# Patient Record
Sex: Female | Born: 1945 | Race: White | Hispanic: No | Marital: Married | Smoking: Former smoker
Health system: Southern US, Community
[De-identification: ages and names within clinical notes are randomized; demographics above are authoritative.]

## PROBLEM LIST (undated history)

## (undated) DIAGNOSIS — IMO0001 Reserved for inherently not codable concepts without codable children: Secondary | ICD-10-CM

## (undated) DIAGNOSIS — F329 Major depressive disorder, single episode, unspecified: Secondary | ICD-10-CM

## (undated) DIAGNOSIS — K219 Gastro-esophageal reflux disease without esophagitis: Secondary | ICD-10-CM

## (undated) DIAGNOSIS — G4700 Insomnia, unspecified: Secondary | ICD-10-CM

## (undated) DIAGNOSIS — G629 Polyneuropathy, unspecified: Secondary | ICD-10-CM

## (undated) DIAGNOSIS — J189 Pneumonia, unspecified organism: Secondary | ICD-10-CM

## (undated) DIAGNOSIS — I10 Essential (primary) hypertension: Secondary | ICD-10-CM

## (undated) DIAGNOSIS — M797 Fibromyalgia: Secondary | ICD-10-CM

## (undated) DIAGNOSIS — G2581 Restless legs syndrome: Secondary | ICD-10-CM

## (undated) DIAGNOSIS — R112 Nausea with vomiting, unspecified: Secondary | ICD-10-CM

## (undated) DIAGNOSIS — D649 Anemia, unspecified: Secondary | ICD-10-CM

## (undated) DIAGNOSIS — F32A Depression, unspecified: Secondary | ICD-10-CM

## (undated) DIAGNOSIS — Z9889 Other specified postprocedural states: Secondary | ICD-10-CM

## (undated) DIAGNOSIS — M1990 Unspecified osteoarthritis, unspecified site: Secondary | ICD-10-CM

## (undated) DIAGNOSIS — K760 Fatty (change of) liver, not elsewhere classified: Secondary | ICD-10-CM

## (undated) DIAGNOSIS — R197 Diarrhea, unspecified: Secondary | ICD-10-CM

## (undated) HISTORY — PX: ABDOMINAL HYSTERECTOMY: SHX81

## (undated) HISTORY — PX: CARPAL TUNNEL RELEASE: SHX101

## (undated) HISTORY — PX: OTHER SURGICAL HISTORY: SHX169

## (undated) HISTORY — PX: KNEE ARTHROSCOPY: SUR90

## (undated) HISTORY — PX: BRAIN SURGERY: SHX531

## (undated) HISTORY — PX: COLONOSCOPY: SHX174

## (undated) HISTORY — PX: TUBAL LIGATION: SHX77

## (undated) HISTORY — PX: KNEE ARTHROPLASTY: SHX992

## (undated) HISTORY — PX: CHOLECYSTECTOMY: SHX55

## (undated) HISTORY — PX: TONSILLECTOMY: SUR1361

---

## 2015-03-25 ENCOUNTER — Other Ambulatory Visit: Payer: Self-pay | Admitting: Neurosurgery

## 2015-03-25 DIAGNOSIS — M5137 Other intervertebral disc degeneration, lumbosacral region: Secondary | ICD-10-CM

## 2015-04-02 ENCOUNTER — Ambulatory Visit
Admission: RE | Admit: 2015-04-02 | Discharge: 2015-04-02 | Disposition: A | Discharge status: Home / Self Care | Payer: Medicare Other | Source: Ambulatory Visit | Attending: Neurosurgery | Admitting: Neurosurgery

## 2015-04-02 DIAGNOSIS — M5137 Other intervertebral disc degeneration, lumbosacral region: Secondary | ICD-10-CM

## 2015-04-02 MED ORDER — DIAZEPAM 5 MG PO TABS
10.0000 mg | ORAL_TABLET | Freq: Once | ORAL | Status: AC
  Administered 2015-04-02: 5 mg via ORAL

## 2015-04-02 MED ORDER — ONDANSETRON HCL 4 MG/2ML IJ SOLN: 4.0000 mg | Freq: Four times a day (QID) | INTRAMUSCULAR | Status: DC | PRN

## 2015-04-02 MED ORDER — IOHEXOL 180 MG/ML  SOLN
15.0000 mL | Freq: Once | INTRAMUSCULAR | Status: AC | PRN
  Administered 2015-04-02: 15 mL via INTRATHECAL

## 2015-04-02 NOTE — Progress Notes (Signed)
Patient states she has been off Lexapro and Trazodone for at least the past two days.

## 2015-04-02 NOTE — Discharge Instructions (Addendum)
Myelogram Discharge Instructions  1. Go home and rest quietly for the next 24 hours.  It is important to lie flat for the next 24 hours.  Get up only to go to the restroom.  You may lie in the bed or on a couch on your back, your stomach, your left side or your right side.  You may have one pillow under your head.  You may have pillows between your knees while you are on your side or under your knees while you are on your back.  2. DO NOT drive today.  Recline the seat as far back as it will go, while still wearing your seat belt, on the way home.  3. You may get up to go to the bathroom as needed.  You may sit up for 10 minutes to eat.  You may resume your normal diet and medications unless otherwise indicated.  Drink lots of extra fluids today and tomorrow.  4. The incidence of headache, nausea, or vomiting is about 5% (one in 20 patients).  If you develop a headache, lie flat and drink plenty of fluids until the headache goes away.  Caffeinated beverages may be helpful.  If you develop severe nausea and vomiting or a headache that does not go away with flat bed rest, call [phone].  5. You may resume normal activities after your 24 hours of bed rest is over; however, do not exert yourself strongly or do any heavy lifting tomorrow. If when you get up you have a headache when standing, go back to bed and force fluids for another 24 hours.  6. Call your physician for a follow-up appointment.  The results of your myelogram will be sent directly to your physician by the following day.  7. If you have any questions or if complications develop after you arrive home, please call [phone].  Discharge instructions have been explained to the patient.  The patient, or the person responsible for the patient, fully understands these instructions.       May resume Lexapro and Trazodone on April 03, 2015, after 11:00 am.

## 2015-04-27 ENCOUNTER — Other Ambulatory Visit: Payer: Self-pay | Admitting: Neurosurgery

## 2015-05-12 ENCOUNTER — Encounter (HOSPITAL_COMMUNITY): Payer: Self-pay

## 2015-05-12 ENCOUNTER — Encounter (HOSPITAL_COMMUNITY)
Admission: RE | Admit: 2015-05-12 | Discharge: 2015-05-12 | Disposition: A | Discharge status: Home / Self Care | Payer: Medicare Other | Source: Ambulatory Visit | Attending: Neurosurgery | Admitting: Neurosurgery

## 2015-05-12 ENCOUNTER — Other Ambulatory Visit (HOSPITAL_COMMUNITY): Payer: Self-pay | Admitting: *Deleted

## 2015-05-12 DIAGNOSIS — Z0183 Encounter for blood typing: Secondary | ICD-10-CM | POA: Insufficient documentation

## 2015-05-12 DIAGNOSIS — Z01810 Encounter for preprocedural cardiovascular examination: Secondary | ICD-10-CM | POA: Insufficient documentation

## 2015-05-12 DIAGNOSIS — I10 Essential (primary) hypertension: Secondary | ICD-10-CM | POA: Insufficient documentation

## 2015-05-12 DIAGNOSIS — Z01812 Encounter for preprocedural laboratory examination: Secondary | ICD-10-CM | POA: Diagnosis not present

## 2015-05-12 HISTORY — DX: Restless legs syndrome: G25.81

## 2015-05-12 HISTORY — DX: Essential (primary) hypertension: I10

## 2015-05-12 HISTORY — DX: Pneumonia, unspecified organism: J18.9

## 2015-05-12 HISTORY — DX: Major depressive disorder, single episode, unspecified: F32.9

## 2015-05-12 HISTORY — DX: Insomnia, unspecified: G47.00

## 2015-05-12 HISTORY — DX: Fatty (change of) liver, not elsewhere classified: K76.0

## 2015-05-12 HISTORY — DX: Anemia, unspecified: D64.9

## 2015-05-12 HISTORY — DX: Reserved for inherently not codable concepts without codable children: IMO0001

## 2015-05-12 HISTORY — DX: Polyneuropathy, unspecified: G62.9

## 2015-05-12 HISTORY — DX: Unspecified osteoarthritis, unspecified site: M19.90

## 2015-05-12 HISTORY — DX: Gastro-esophageal reflux disease without esophagitis: K21.9

## 2015-05-12 HISTORY — DX: Depression, unspecified: F32.A

## 2015-05-12 HISTORY — DX: Diarrhea, unspecified: R19.7

## 2015-05-12 HISTORY — DX: Nausea with vomiting, unspecified: R11.2

## 2015-05-12 HISTORY — DX: Nausea with vomiting, unspecified: Z98.890

## 2015-05-12 LAB — COMPREHENSIVE METABOLIC PANEL
ALK PHOS: 76 U/L (ref 38–126)
ALT: 28 U/L (ref 14–54)
AST: 24 U/L (ref 15–41)
Albumin: 3.8 g/dL (ref 3.5–5.0)
Anion gap: 5 (ref 5–15)
BUN: 12 mg/dL (ref 6–20)
CALCIUM: 9.5 mg/dL (ref 8.9–10.3)
CHLORIDE: 102 mmol/L (ref 101–111)
CO2: 30 mmol/L (ref 22–32)
CREATININE: 0.86 mg/dL (ref 0.44–1.00)
GFR calc Af Amer: >60 mL/min (ref >60)
Glucose, Bld: 111 mg/dL — ABNORMAL HIGH (ref 65–99)
Potassium: 4.2 mmol/L (ref 3.5–5.1)
Sodium: 137 mmol/L (ref 135–145)
Total Bilirubin: 0.6 mg/dL (ref 0.3–1.2)
Total Protein: 6.2 g/dL — ABNORMAL LOW (ref 6.5–8.1)

## 2015-05-12 LAB — CBC
HEMATOCRIT: 40.7 % (ref 36.0–46.0)
Hemoglobin: 13.3 g/dL (ref 12.0–15.0)
MCH: 30.4 pg (ref 26.0–34.0)
MCHC: 32.7 g/dL (ref 30.0–36.0)
MCV: 92.9 fL (ref 78.0–100.0)
PLATELETS: 235 10*3/uL (ref 150–400)
RBC: 4.38 MIL/uL (ref 3.87–5.11)
RDW: 12.7 % (ref 11.5–15.5)
WBC: 5.7 10*3/uL (ref 4.0–10.5)

## 2015-05-12 LAB — TYPE AND SCREEN
ABO/RH(D): A POS
Antibody Screen: NEGATIVE

## 2015-05-12 LAB — ABO/RH: ABO/RH(D): A POS

## 2015-05-12 LAB — SURGICAL PCR SCREEN
MRSA, PCR: NEGATIVE
Staphylococcus aureus: POSITIVE — AB

## 2015-05-12 NOTE — Progress Notes (Signed)
Mupirocin Ointment Rx called into [facility] on [address], [place] for positive PCR of Staph. Pt notified and voiced understanding.

## 2015-05-12 NOTE — Pre-Procedure Instructions (Signed)
Joyce Kelly  05/12/2015     Your procedure is scheduled on Wednesday, May 20, 2015 at 9:30 AM.   Report to [facility] Entrance "A" Admitting Office at 7:30 AM.   Call this number if you have problems the morning of surgery: [phone]   Any questions prior to day of surgery, please call [phone] between 8 & 4 PM.   Remember:  Do not eat food or drink liquids after midnight Tuesday, 05/19/15.  Take these medicines the morning of surgery with A SIP OF WATER: Excitalopram (Lexapro), Gabapentin (Neurontin), Ropinirole (Requip), Hydrocodone - if needed  Stop Meloxicam, Herbal Medications and Vitamins 7 days prior to surgery   Do not wear jewelry, make-up or nail polish.  Do not wear lotions, powders, or perfumes.  You may wear deodorant.  Do not shave 48 hours prior to surgery.    Do not bring valuables to the hospital.  [facility] is not responsible for any belongings or valuables.  Contacts, dentures or bridgework may not be worn into surgery.  Leave your suitcase in the car.  After surgery it may be brought to your room.  For patients admitted to the hospital, discharge time will be determined by your treatment team.  Special instructions:  [place] - Preparing for Surgery  Before surgery, you can play an important role.  Because skin is not sterile, your skin needs to be as free of germs as possible.  You can reduce the number of germs on you skin by washing with CHG (chlorahexidine gluconate) soap before surgery.  CHG is an antiseptic cleaner which kills germs and bonds with the skin to continue killing germs even after washing.  Please DO NOT use if you have an allergy to CHG or antibacterial soaps.  If your skin becomes reddened/irritated stop using the CHG and inform your nurse when you arrive at Short Stay.  Do not shave (including legs and underarms) for at least 48 hours prior to the first CHG shower.  You may shave your face.  Please follow these  instructions carefully:   1.  Shower with CHG Soap the night before surgery and the                                morning of Surgery.  2.  If you choose to wash your hair, wash your hair first as usual with your       normal shampoo.  3.  After you shampoo, rinse your hair and body thoroughly to remove the                      Shampoo.  4.  Use CHG as you would any other liquid soap.  You can apply chg directly       to the skin and wash gently with scrungie or a clean washcloth.  5.  Apply the CHG Soap to your body ONLY FROM THE NECK DOWN.        Do not use on open wounds or open sores.  Avoid contact with your eyes, ears, mouth and genitals (private parts).  Wash genitals (private parts) with your normal soap.  6.  Wash thoroughly, paying special attention to the area where your surgery        will be performed.  7.  Thoroughly rinse your body with warm water from the neck down.  8.  DO NOT  shower/wash with your normal soap after using and rinsing off       the CHG Soap.  9.  Pat yourself dry with a clean towel.            10.  Wear clean pajamas.            11.  Place clean sheets on your bed the night of your first shower and do not        sleep with pets.  Day of Surgery  Do not apply any lotions the morning of surgery.  Please wear clean clothes to the hospital.   Please read over the following fact sheets that you were given. Pain Booklet, Coughing and Deep Breathing, Blood Transfusion Information, MRSA Information and Surgical Site Infection Prevention

## 2015-05-12 NOTE — Progress Notes (Signed)
Pt denies cardiac history, denies any chest pain. Pt's PCP is Dr. Julius Bowels in [facility].

## 2015-05-20 ENCOUNTER — Inpatient Hospital Stay (HOSPITAL_COMMUNITY): Payer: Medicare Other | Admitting: Certified Registered Nurse Anesthetist

## 2015-05-20 ENCOUNTER — Encounter (HOSPITAL_COMMUNITY)
Admission: RE | Disposition: A | Discharge status: Home / Self Care | Payer: Medicare Other | Source: Ambulatory Visit | Attending: Neurosurgery

## 2015-05-20 ENCOUNTER — Inpatient Hospital Stay (HOSPITAL_COMMUNITY): Payer: Medicare Other

## 2015-05-20 ENCOUNTER — Encounter (HOSPITAL_COMMUNITY): Payer: Self-pay | Admitting: General Practice

## 2015-05-20 ENCOUNTER — Inpatient Hospital Stay (HOSPITAL_COMMUNITY)
Admission: RE | Admit: 2015-05-20 | Discharge: 2015-05-23 | DRG: 460 | Disposition: A | Discharge status: Home / Self Care | Payer: Medicare Other | Source: Ambulatory Visit | Attending: Neurosurgery | Admitting: Neurosurgery

## 2015-05-20 DIAGNOSIS — Z8249 Family history of ischemic heart disease and other diseases of the circulatory system: Secondary | ICD-10-CM | POA: Diagnosis not present

## 2015-05-20 DIAGNOSIS — M5417 Radiculopathy, lumbosacral region: Secondary | ICD-10-CM | POA: Diagnosis not present

## 2015-05-20 DIAGNOSIS — M4316 Spondylolisthesis, lumbar region: Principal | ICD-10-CM | POA: Diagnosis present

## 2015-05-20 DIAGNOSIS — Z885 Allergy status to narcotic agent status: Secondary | ICD-10-CM | POA: Diagnosis not present

## 2015-05-20 DIAGNOSIS — K219 Gastro-esophageal reflux disease without esophagitis: Secondary | ICD-10-CM | POA: Diagnosis present

## 2015-05-20 DIAGNOSIS — Z881 Allergy status to other antibiotic agents status: Secondary | ICD-10-CM

## 2015-05-20 DIAGNOSIS — M4326 Fusion of spine, lumbar region: Secondary | ICD-10-CM

## 2015-05-20 DIAGNOSIS — Z87891 Personal history of nicotine dependence: Secondary | ICD-10-CM | POA: Diagnosis not present

## 2015-05-20 DIAGNOSIS — M545 Low back pain: Secondary | ICD-10-CM | POA: Diagnosis present

## 2015-05-20 DIAGNOSIS — I10 Essential (primary) hypertension: Secondary | ICD-10-CM | POA: Diagnosis present

## 2015-05-20 DIAGNOSIS — Z886 Allergy status to analgesic agent status: Secondary | ICD-10-CM | POA: Diagnosis not present

## 2015-05-20 DIAGNOSIS — M4807 Spinal stenosis, lumbosacral region: Secondary | ICD-10-CM | POA: Diagnosis present

## 2015-05-20 DIAGNOSIS — Z887 Allergy status to serum and vaccine status: Secondary | ICD-10-CM | POA: Diagnosis not present

## 2015-05-20 DIAGNOSIS — M48061 Spinal stenosis, lumbar region without neurogenic claudication: Secondary | ICD-10-CM | POA: Diagnosis present

## 2015-05-20 SURGERY — POSTERIOR LUMBAR FUSION 2 LEVEL
Anesthesia: General | Site: Back

## 2015-05-20 MED ORDER — MIDAZOLAM HCL 5 MG/5ML IJ SOLN
INTRAMUSCULAR | Status: DC | PRN
  Administered 2015-05-20: 2 mg via INTRAVENOUS

## 2015-05-20 MED ORDER — SODIUM CHLORIDE 0.9 % IJ SOLN
INTRAMUSCULAR | Status: AC
  Filled 2015-05-20: qty 10

## 2015-05-20 MED ORDER — DIPHENHYDRAMINE HCL 25 MG PO CAPS: 25.0000 mg | ORAL_CAPSULE | Freq: Every evening | ORAL | Status: DC | PRN

## 2015-05-20 MED ORDER — GABAPENTIN 100 MG PO CAPS
100.0000 mg | ORAL_CAPSULE | Freq: Three times a day (TID) | ORAL | Status: DC
  Administered 2015-05-20 – 2015-05-23 (×9): 100 mg via ORAL
  Filled 2015-05-20 (×9): qty 1

## 2015-05-20 MED ORDER — THROMBIN 20000 UNITS EX SOLR
CUTANEOUS | Status: DC | PRN
  Administered 2015-05-20 (×2): 20 mL via TOPICAL

## 2015-05-20 MED ORDER — CEFAZOLIN SODIUM-DEXTROSE 2-3 GM-% IV SOLR
2.0000 g | INTRAVENOUS | Status: AC
  Administered 2015-05-20 (×2): 2 g via INTRAVENOUS
  Filled 2015-05-20: qty 50

## 2015-05-20 MED ORDER — MENTHOL 3 MG MT LOZG: 1.0000 | LOZENGE | OROMUCOSAL | Status: DC | PRN

## 2015-05-20 MED ORDER — PROPOFOL 10 MG/ML IV BOLUS
INTRAVENOUS | Status: DC | PRN
  Administered 2015-05-20: 100 mg via INTRAVENOUS

## 2015-05-20 MED ORDER — PROPOFOL 10 MG/ML IV BOLUS
INTRAVENOUS | Status: AC
  Filled 2015-05-20: qty 20

## 2015-05-20 MED ORDER — IRBESARTAN 300 MG PO TABS
300.0000 mg | ORAL_TABLET | Freq: Every day | ORAL | Status: DC
  Administered 2015-05-20 – 2015-05-22 (×3): 300 mg via ORAL
  Filled 2015-05-20 (×4): qty 1

## 2015-05-20 MED ORDER — FENTANYL CITRATE (PF) 100 MCG/2ML IJ SOLN
INTRAMUSCULAR | Status: AC
  Filled 2015-05-20: qty 2

## 2015-05-20 MED ORDER — FENTANYL CITRATE (PF) 250 MCG/5ML IJ SOLN
INTRAMUSCULAR | Status: AC
  Filled 2015-05-20: qty 5

## 2015-05-20 MED ORDER — SODIUM CHLORIDE 0.9 % IJ SOLN
3.0000 mL | Freq: Two times a day (BID) | INTRAMUSCULAR | Status: DC
  Administered 2015-05-20 – 2015-05-22 (×3): 3 mL via INTRAVENOUS

## 2015-05-20 MED ORDER — SODIUM CHLORIDE 0.9 % IR SOLN
Status: DC | PRN
  Administered 2015-05-20: 500 mL

## 2015-05-20 MED ORDER — CEFAZOLIN SODIUM-DEXTROSE 2-3 GM-% IV SOLR
2.0000 g | Freq: Three times a day (TID) | INTRAVENOUS | Status: AC
Start: 2015-05-20 — End: 2015-05-22
  Administered 2015-05-20 – 2015-05-22 (×6): 2 g via INTRAVENOUS
  Filled 2015-05-20 (×7): qty 50

## 2015-05-20 MED ORDER — HYDROCODONE-ACETAMINOPHEN 10-325 MG PO TABS
1.0000 | ORAL_TABLET | Freq: Four times a day (QID) | ORAL | Status: DC | PRN
  Administered 2015-05-21 – 2015-05-22 (×2): 1 via ORAL
  Filled 2015-05-20 (×2): qty 1

## 2015-05-20 MED ORDER — MIDAZOLAM HCL 2 MG/2ML IJ SOLN
INTRAMUSCULAR | Status: AC
  Filled 2015-05-20: qty 4

## 2015-05-20 MED ORDER — LIDOCAINE-EPINEPHRINE 1 %-1:100000 IJ SOLN
INTRAMUSCULAR | Status: DC | PRN
  Administered 2015-05-20: 10 mL

## 2015-05-20 MED ORDER — ACETAMINOPHEN 325 MG PO TABS: 650.0000 mg | ORAL_TABLET | ORAL | Status: DC | PRN

## 2015-05-20 MED ORDER — ONDANSETRON HCL 4 MG/2ML IJ SOLN
INTRAMUSCULAR | Status: AC
  Filled 2015-05-20: qty 2

## 2015-05-20 MED ORDER — NEOSTIGMINE METHYLSULFATE 10 MG/10ML IV SOLN
INTRAVENOUS | Status: DC | PRN
  Administered 2015-05-20: 4.5 mg via INTRAVENOUS

## 2015-05-20 MED ORDER — ESCITALOPRAM OXALATE 10 MG PO TABS
10.0000 mg | ORAL_TABLET | Freq: Every day | ORAL | Status: DC
  Administered 2015-05-21 – 2015-05-23 (×3): 10 mg via ORAL
  Filled 2015-05-20 (×3): qty 1

## 2015-05-20 MED ORDER — POTASSIUM CHLORIDE CRYS ER 10 MEQ PO TBCR
10.0000 meq | EXTENDED_RELEASE_TABLET | Freq: Every day | ORAL | Status: DC
  Administered 2015-05-20 – 2015-05-23 (×4): 10 meq via ORAL
  Filled 2015-05-20 (×4): qty 1

## 2015-05-20 MED ORDER — ROCURONIUM BROMIDE 50 MG/5ML IV SOLN
INTRAVENOUS | Status: AC
  Filled 2015-05-20: qty 1

## 2015-05-20 MED ORDER — GLYCOPYRROLATE 0.2 MG/ML IJ SOLN
INTRAMUSCULAR | Status: AC
  Filled 2015-05-20: qty 4

## 2015-05-20 MED ORDER — PROMETHAZINE HCL 25 MG/ML IJ SOLN: 6.2500 mg | INTRAMUSCULAR | Status: DC | PRN

## 2015-05-20 MED ORDER — POTASSIUM GLUCONATE 550 MG PO TABS: 1.0000 | ORAL_TABLET | Freq: Three times a day (TID) | ORAL | Status: DC

## 2015-05-20 MED ORDER — ROCURONIUM BROMIDE 50 MG/5ML IV SOLN
INTRAVENOUS | Status: AC
  Filled 2015-05-20: qty 3

## 2015-05-20 MED ORDER — LACTATED RINGERS IV SOLN
INTRAVENOUS | Status: DC
  Administered 2015-05-20 (×2): via INTRAVENOUS

## 2015-05-20 MED ORDER — PROPOFOL 500 MG/50ML IV EMUL
INTRAVENOUS | Status: AC
  Filled 2015-05-20: qty 100

## 2015-05-20 MED ORDER — ARTIFICIAL TEARS OP OINT
TOPICAL_OINTMENT | OPHTHALMIC | Status: DC | PRN
  Administered 2015-05-20: 1 via OPHTHALMIC

## 2015-05-20 MED ORDER — DEXAMETHASONE SODIUM PHOSPHATE 10 MG/ML IJ SOLN
10.0000 mg | INTRAMUSCULAR | Status: AC
  Administered 2015-05-20: 10 mg via INTRAVENOUS
  Filled 2015-05-20: qty 1

## 2015-05-20 MED ORDER — VANCOMYCIN HCL 1000 MG IV SOLR
INTRAVENOUS | Status: DC | PRN
  Administered 2015-05-20: 1000 mg

## 2015-05-20 MED ORDER — SODIUM CHLORIDE 0.9 % IV SOLN: 250.0000 mL | INTRAVENOUS | Status: DC

## 2015-05-20 MED ORDER — ROCURONIUM BROMIDE 100 MG/10ML IV SOLN
INTRAVENOUS | Status: DC | PRN
  Administered 2015-05-20 (×2): 10 mg via INTRAVENOUS
  Administered 2015-05-20: 20 mg via INTRAVENOUS
  Administered 2015-05-20 (×2): 10 mg via INTRAVENOUS
  Administered 2015-05-20: 20 mg via INTRAVENOUS
  Administered 2015-05-20 (×2): 10 mg via INTRAVENOUS
  Administered 2015-05-20: 50 mg via INTRAVENOUS

## 2015-05-20 MED ORDER — OLMESARTAN MEDOXOMIL-HCTZ 40-12.5 MG PO TABS: 1.0000 | ORAL_TABLET | Freq: Every day | ORAL | Status: DC

## 2015-05-20 MED ORDER — BUPIVACAINE LIPOSOME 1.3 % IJ SUSP
20.0000 mL | INTRAMUSCULAR | Status: AC
  Filled 2015-05-20: qty 20

## 2015-05-20 MED ORDER — CYCLOBENZAPRINE HCL 10 MG PO TABS
10.0000 mg | ORAL_TABLET | Freq: Three times a day (TID) | ORAL | Status: DC | PRN
  Administered 2015-05-21: 10 mg via ORAL
  Filled 2015-05-20: qty 1

## 2015-05-20 MED ORDER — PHENOL 1.4 % MT LIQD
1.0000 | OROMUCOSAL | Status: DC | PRN
Start: 2015-05-20 — End: 2015-05-23

## 2015-05-20 MED ORDER — SODIUM CHLORIDE 0.9 % IJ SOLN: 3.0000 mL | INTRAMUSCULAR | Status: DC | PRN

## 2015-05-20 MED ORDER — TRAZODONE HCL 100 MG PO TABS
100.0000 mg | ORAL_TABLET | Freq: Every day | ORAL | Status: DC
  Administered 2015-05-20 – 2015-05-22 (×3): 100 mg via ORAL
  Filled 2015-05-20 (×3): qty 1

## 2015-05-20 MED ORDER — EPHEDRINE SULFATE 50 MG/ML IJ SOLN
INTRAMUSCULAR | Status: DC | PRN
  Administered 2015-05-20: 5 mg via INTRAVENOUS
  Administered 2015-05-20: 15 mg via INTRAVENOUS

## 2015-05-20 MED ORDER — GLYCOPYRROLATE 0.2 MG/ML IJ SOLN
INTRAMUSCULAR | Status: AC
  Filled 2015-05-20: qty 1

## 2015-05-20 MED ORDER — SCOPOLAMINE 1 MG/3DAYS TD PT72
MEDICATED_PATCH | TRANSDERMAL | Status: AC
  Administered 2015-05-20: 1 via TRANSDERMAL
  Filled 2015-05-20: qty 1

## 2015-05-20 MED ORDER — OXYCODONE-ACETAMINOPHEN 5-325 MG PO TABS
ORAL_TABLET | ORAL | Status: AC
Start: 2015-05-20 — End: 2015-05-21
  Filled 2015-05-20: qty 2

## 2015-05-20 MED ORDER — HYDROCHLOROTHIAZIDE 12.5 MG PO CAPS
12.5000 mg | ORAL_CAPSULE | Freq: Every day | ORAL | Status: DC
  Administered 2015-05-20 – 2015-05-22 (×3): 12.5 mg via ORAL
  Filled 2015-05-20 (×4): qty 1

## 2015-05-20 MED ORDER — TIZANIDINE HCL 2 MG PO TABS
2.0000 mg | ORAL_TABLET | Freq: Every day | ORAL | Status: DC
  Administered 2015-05-20 – 2015-05-22 (×3): 2 mg via ORAL
  Filled 2015-05-20 (×4): qty 1

## 2015-05-20 MED ORDER — NEOSTIGMINE METHYLSULFATE 10 MG/10ML IV SOLN
INTRAVENOUS | Status: AC
  Filled 2015-05-20: qty 2

## 2015-05-20 MED ORDER — CEFAZOLIN SODIUM-DEXTROSE 2-3 GM-% IV SOLR
INTRAVENOUS | Status: AC
  Filled 2015-05-20: qty 50

## 2015-05-20 MED ORDER — LIDOCAINE HCL (CARDIAC) 20 MG/ML IV SOLN
INTRAVENOUS | Status: DC | PRN
  Administered 2015-05-20: 100 mg via INTRAVENOUS

## 2015-05-20 MED ORDER — ROPINIROLE HCL 1 MG PO TABS
1.0000 mg | ORAL_TABLET | Freq: Two times a day (BID) | ORAL | Status: DC
  Administered 2015-05-20 – 2015-05-23 (×6): 1 mg via ORAL
  Filled 2015-05-20 (×6): qty 1

## 2015-05-20 MED ORDER — CALCIUM-MAGNESIUM-ZINC PO TABS: 1.0000 | ORAL_TABLET | Freq: Every day | ORAL | Status: DC

## 2015-05-20 MED ORDER — DOCUSATE SODIUM 100 MG PO CAPS
100.0000 mg | ORAL_CAPSULE | Freq: Two times a day (BID) | ORAL | Status: DC
  Administered 2015-05-20 – 2015-05-23 (×6): 100 mg via ORAL
  Filled 2015-05-20 (×6): qty 1

## 2015-05-20 MED ORDER — BUPIVACAINE LIPOSOME 1.3 % IJ SUSP
INTRAMUSCULAR | Status: DC | PRN
  Administered 2015-05-20: 20 mL

## 2015-05-20 MED ORDER — ONDANSETRON HCL 4 MG/2ML IJ SOLN: 4.0000 mg | INTRAMUSCULAR | Status: DC | PRN

## 2015-05-20 MED ORDER — OXYCODONE-ACETAMINOPHEN 5-325 MG PO TABS
1.0000 | ORAL_TABLET | ORAL | Status: DC | PRN
  Administered 2015-05-20 – 2015-05-23 (×13): 2 via ORAL
  Filled 2015-05-20 (×12): qty 2

## 2015-05-20 MED ORDER — PHENYLEPHRINE HCL 10 MG/ML IJ SOLN
INTRAMUSCULAR | Status: DC | PRN
  Administered 2015-05-20: 40 ug via INTRAVENOUS
  Administered 2015-05-20: 80 ug via INTRAVENOUS

## 2015-05-20 MED ORDER — PROPOFOL 1000 MG/100ML IV EMUL
INTRAVENOUS | Status: AC
  Administered 2015-05-20: 50 ug/kg/min via INTRAVENOUS
  Filled 2015-05-20: qty 100

## 2015-05-20 MED ORDER — EPHEDRINE SULFATE 50 MG/ML IJ SOLN
INTRAMUSCULAR | Status: AC
  Filled 2015-05-20: qty 1

## 2015-05-20 MED ORDER — TART CHERRY ADVANCED PO CAPS: ORAL_CAPSULE | Freq: Two times a day (BID) | ORAL | Status: DC

## 2015-05-20 MED ORDER — VANCOMYCIN HCL 1000 MG IV SOLR
INTRAVENOUS | Status: AC
  Filled 2015-05-20: qty 1000

## 2015-05-20 MED ORDER — LIDOCAINE HCL (CARDIAC) 20 MG/ML IV SOLN
INTRAVENOUS | Status: AC
  Filled 2015-05-20: qty 5

## 2015-05-20 MED ORDER — HYDROMORPHONE HCL 1 MG/ML IJ SOLN
0.5000 mg | INTRAMUSCULAR | Status: DC | PRN
  Administered 2015-05-20 – 2015-05-21 (×3): 1 mg via INTRAVENOUS
  Filled 2015-05-20 (×3): qty 1

## 2015-05-20 MED ORDER — SENNOSIDES-DOCUSATE SODIUM 8.6-50 MG PO TABS
1.0000 | ORAL_TABLET | Freq: Every evening | ORAL | Status: DC | PRN
  Administered 2015-05-22: 1 via ORAL
  Filled 2015-05-20: qty 1

## 2015-05-20 MED ORDER — FENTANYL CITRATE (PF) 100 MCG/2ML IJ SOLN
25.0000 ug | INTRAMUSCULAR | Status: DC | PRN
  Administered 2015-05-20: 50 ug via INTRAVENOUS
  Administered 2015-05-20 (×2): 25 ug via INTRAVENOUS

## 2015-05-20 MED ORDER — GLYCOPYRROLATE 0.2 MG/ML IJ SOLN
INTRAMUSCULAR | Status: DC | PRN
  Administered 2015-05-20: .7 mg via INTRAVENOUS

## 2015-05-20 MED ORDER — ONDANSETRON HCL 4 MG/2ML IJ SOLN
INTRAMUSCULAR | Status: DC | PRN
  Administered 2015-05-20: 4 mg via INTRAVENOUS

## 2015-05-20 MED ORDER — ACETAMINOPHEN 650 MG RE SUPP: 650.0000 mg | RECTAL | Status: DC | PRN

## 2015-05-20 MED ORDER — TURMERIC CURCUMIN PO CAPS
ORAL_CAPSULE | Freq: Two times a day (BID) | ORAL | Status: DC
Start: 2015-05-20 — End: 2015-05-20

## 2015-05-20 MED ORDER — SUCCINYLCHOLINE CHLORIDE 20 MG/ML IJ SOLN
INTRAMUSCULAR | Status: AC
  Filled 2015-05-20: qty 1

## 2015-05-20 MED ORDER — SODIUM CHLORIDE 0.9 % IV SOLN
INTRAVENOUS | Status: DC | PRN
  Administered 2015-05-20: 14:00:00 via INTRAVENOUS

## 2015-05-20 MED ORDER — FENTANYL CITRATE (PF) 100 MCG/2ML IJ SOLN
INTRAMUSCULAR | Status: DC | PRN
  Administered 2015-05-20 (×6): 50 ug via INTRAVENOUS
  Administered 2015-05-20: 100 ug via INTRAVENOUS

## 2015-05-20 MED ORDER — SODIUM CHLORIDE 0.9 % IR SOLN
Status: DC | PRN
  Administered 2015-05-20: 1000 mL

## 2015-05-20 SURGICAL SUPPLY — 75 items
BAG DECANTER FOR FLEXI CONT (MISCELLANEOUS) ×3 IMPLANT
BENZOIN TINCTURE PRP APPL 2/3 (GAUZE/BANDAGES/DRESSINGS) ×3 IMPLANT
BIT DRILL 5.0/4.0 (BIT) ×1 IMPLANT
BLADE CLIPPER SURG (BLADE) IMPLANT
BLADE SURG 11 STRL SS (BLADE) ×3 IMPLANT
BONE ALLOSTEM MORSELIZED 5CC (Bone Implant) ×3 IMPLANT
BRUSH SCRUB EZ PLAIN DRY (MISCELLANEOUS) ×3 IMPLANT
BUR MATCHSTICK NEURO 3.0 LAGG (BURR) ×3 IMPLANT
BUR PRECISION FLUTE 6.0 (BURR) ×3 IMPLANT
CANISTER SUCT 3000ML PPV (MISCELLANEOUS) ×3 IMPLANT
CAP LOCKING (Cap) ×12 IMPLANT
CAP LOCKING 5.5 CREO (Cap) ×6 IMPLANT
CLOSURE WOUND 1/2 X4 (GAUZE/BANDAGES/DRESSINGS) ×2
CONT SPEC 4OZ CLIKSEAL STRL BL (MISCELLANEOUS) ×3 IMPLANT
COVER BACK TABLE 60X90IN (DRAPES) ×3 IMPLANT
DECANTER SPIKE VIAL GLASS SM (MISCELLANEOUS) ×3 IMPLANT
DRAPE C-ARM 42X72 X-RAY (DRAPES) ×3 IMPLANT
DRAPE C-ARMOR (DRAPES) ×3 IMPLANT
DRAPE LAPAROTOMY 100X72X124 (DRAPES) ×3 IMPLANT
DRAPE POUCH INSTRU U-SHP 10X18 (DRAPES) ×3 IMPLANT
DRAPE PROXIMA HALF (DRAPES) IMPLANT
DRAPE SURG 17X23 STRL (DRAPES) ×3 IMPLANT
DRILL 5.0/4.0 (BIT) ×3
DRSG OPSITE POSTOP 4X8 (GAUZE/BANDAGES/DRESSINGS) ×3 IMPLANT
DURAPREP 26ML APPLICATOR (WOUND CARE) ×3 IMPLANT
ELECT REM PT RETURN 9FT ADLT (ELECTROSURGICAL) ×3
ELECTRODE REM PT RTRN 9FT ADLT (ELECTROSURGICAL) ×1 IMPLANT
EVACUATOR 3/16  PVC DRAIN (DRAIN) ×2
EVACUATOR 3/16 PVC DRAIN (DRAIN) ×1 IMPLANT
GAUZE SPONGE 4X4 12PLY STRL (GAUZE/BANDAGES/DRESSINGS) ×3 IMPLANT
GAUZE SPONGE 4X4 16PLY XRAY LF (GAUZE/BANDAGES/DRESSINGS) IMPLANT
GLOVE BIO SURGEON STRL SZ8 (GLOVE) ×6 IMPLANT
GLOVE EXAM NITRILE LRG STRL (GLOVE) IMPLANT
GLOVE EXAM NITRILE MD LF STRL (GLOVE) IMPLANT
GLOVE EXAM NITRILE XL STR (GLOVE) IMPLANT
GLOVE EXAM NITRILE XS STR PU (GLOVE) IMPLANT
GLOVE INDICATOR 8.5 STRL (GLOVE) ×6 IMPLANT
GOWN STRL REUS W/ TWL LRG LVL3 (GOWN DISPOSABLE) IMPLANT
GOWN STRL REUS W/ TWL XL LVL3 (GOWN DISPOSABLE) ×2 IMPLANT
GOWN STRL REUS W/TWL 2XL LVL3 (GOWN DISPOSABLE) IMPLANT
GOWN STRL REUS W/TWL LRG LVL3 (GOWN DISPOSABLE)
GOWN STRL REUS W/TWL XL LVL3 (GOWN DISPOSABLE) ×4
GRAFT BN 10X1XDBM MAGNIFUSE (Bone Implant) ×1 IMPLANT
GRAFT BONE MAGNIFUSE 1X10CM (Bone Implant) ×2 IMPLANT
IMPL SPINE RISE 8-15-10-26M (Neuro Prosthesis/Implant) ×2 IMPLANT
IMPLANT SPINE RISE 8-15-10-26M (Neuro Prosthesis/Implant) ×6 IMPLANT
KIT BASIN OR (CUSTOM PROCEDURE TRAY) ×3 IMPLANT
KIT INFUSE XX SMALL 0.7CC (Orthopedic Implant) ×3 IMPLANT
KIT ROOM TURNOVER OR (KITS) ×3 IMPLANT
LIQUID BAND (GAUZE/BANDAGES/DRESSINGS) ×3 IMPLANT
NEEDLE HYPO 21X1.5 SAFETY (NEEDLE) ×3 IMPLANT
NEEDLE HYPO 25X1 1.5 SAFETY (NEEDLE) ×3 IMPLANT
NS IRRIG 1000ML POUR BTL (IV SOLUTION) ×3 IMPLANT
PACK LAMINECTOMY NEURO (CUSTOM PROCEDURE TRAY) ×3 IMPLANT
PAD ARMBOARD 7.5X6 YLW CONV (MISCELLANEOUS) ×9 IMPLANT
ROD 65MM SPINAL (Rod) ×4 IMPLANT
ROD SPNL 5.5 CREO TI 65 (Rod) ×2 IMPLANT
SCREW CORT CREO 6.0-5.0X30MM (Screw) ×6 IMPLANT
SCREW CORT CREO 6.5-5.5X35 (Screw) ×6 IMPLANT
SCREW MOD 6.0-5.0X35MM (Screw) ×6 IMPLANT
SCREW PA THRD CREO TULIP 5.5X4 (Head) ×6 IMPLANT
SPACER RISE 10X26 10-17MM (Spacer) ×6 IMPLANT
SPONGE LAP 4X18 X RAY DECT (DISPOSABLE) IMPLANT
SPONGE SURGIFOAM ABS GEL 100 (HEMOSTASIS) ×3 IMPLANT
STRIP CLOSURE SKIN 1/2X4 (GAUZE/BANDAGES/DRESSINGS) ×4 IMPLANT
SUT VIC AB 0 CT1 18XCR BRD8 (SUTURE) ×1 IMPLANT
SUT VIC AB 0 CT1 8-18 (SUTURE) ×2
SUT VIC AB 2-0 CT1 18 (SUTURE) ×3 IMPLANT
SUT VICRYL 4-0 PS2 18IN ABS (SUTURE) ×3 IMPLANT
SYR 20CC LL (SYRINGE) ×3 IMPLANT
TAPE STRIPS DRAPE STRL (GAUZE/BANDAGES/DRESSINGS) ×3 IMPLANT
TOWEL OR 17X24 6PK STRL BLUE (TOWEL DISPOSABLE) ×3 IMPLANT
TOWEL OR 17X26 10 PK STRL BLUE (TOWEL DISPOSABLE) ×3 IMPLANT
TRAY FOLEY W/METER SILVER 14FR (SET/KITS/TRAYS/PACK) ×3 IMPLANT
WATER STERILE IRR 1000ML POUR (IV SOLUTION) ×3 IMPLANT

## 2015-05-20 NOTE — H&P (Signed)
Joyce Kelly is an 69 y.o. female.   Chief Complaint: Back and left greater than right leg pain HPI: Patient is a very pleasant 69 year old female is a progress worsening back and bilateral leg pain worse on the left predominantly posterior 5 posterior lateral calf top of the foot as well as outside and bottom her foot consistent with both an L5 and S1 nerve pattern. Workup has revealed severe degree of this disease lumbar spinal stenosis and grade 1 spondylolisthesis at L4-5 and compression of both the L5 and S1 nerve roots worse on the left. Due to patient's failure conservative treatment imaging findings and progression of clinical syndrome I recommended decompression stabilization procedure at those 2 levels I extensively went over the risks and benefits of the operation with the patient as well as perioperative course expectations of outcome and alternatives of surgery and she understood and agreed to proceed forward.  Past Medical History  Diagnosis Date  . Shortness of breath dyspnea     with exertion  . Hypertension   . PONV (postoperative nausea and vomiting)   . Restless legs syndrome (RLS)   . Pneumonia   . Depression   . GERD (gastroesophageal reflux disease)   . Neuropathy     both feet  . Arthritis   . Anemia   . Fatty liver   . Diarrhea   . Insomnia     per pt "very severe"    Past Surgical History  Procedure Laterality Date  . Knee arthroplasty Right   . Brain surgery      Cerebral aneurysm - has clips   . Colonoscopy    . Tonsillectomy    . Abdominal hysterectomy      partial  . Tubal ligation    . Cholecystectomy    . Carpal tunnel release Right   . Thumb surgery Left   . Knee arthroscopy Right     Family History  Problem Relation Age of Onset  . Thyroid disease Mother   . Hypertension Mother   . Lung disease Father    Social History:  reports that she quit smoking about 33 years ago. She has never used smokeless tobacco. She reports that she drinks  alcohol. She reports that she does not use illicit drugs.  Allergies:  Allergies  Allergen Reactions  . Meloxicam Diarrhea and Shortness Of Breath  . Pneumococcal Vac Polyvalent Swelling  . Pregabalin Swelling  . Codeine Nausea And Vomiting  . Erythromycin     Made skin feel crazy   . Lisinopril     Other reaction(s): Other (See Comments) Cough  . Nsaids     Tears up stomach  . Tramadol Other (See Comments)    Headache    Medications Prior to Admission  Medication Sig Dispense Refill  . diphenhydrAMINE (BENADRYL) 25 mg capsule Take 25 mg by mouth at bedtime as needed for itching.    . escitalopram (LEXAPRO) 10 MG tablet Take 10 mg by mouth daily.    Marland Kitchen gabapentin (NEURONTIN) 100 MG capsule Take 100 mg by mouth 3 (three) times daily.    Marland Kitchen HYDROcodone-acetaminophen (NORCO) 10-325 MG per tablet Take 1 tablet by mouth every 6 (six) hours as needed for severe pain.    . hydrocortisone cream 0.5 % Apply 1 application topically at bedtime as needed for itching.    . meloxicam (MOBIC) 15 MG tablet Take 15 mg by mouth daily as needed for pain.    . Misc Natural Products (TART CHERRY ADVANCED PO)  Take 1,200 mg by mouth 2 (two) times daily.    . Multiple Minerals (CALCIUM-MAGNESIUM-ZINC) TABS Take 1 tablet by mouth daily.    Marland Kitchen olmesartan-hydrochlorothiazide (BENICAR HCT) 40-12.5 MG per tablet Take 1 tablet by mouth daily.    . Potassium Gluconate 550 MG TABS Take 1 tablet by mouth 3 (three) times daily.    Marland Kitchen rOPINIRole (REQUIP) 1 MG tablet Take 1 mg by mouth 2 (two) times daily.    Marland Kitchen tiZANidine (ZANAFLEX) 2 MG tablet Take 2 mg by mouth at bedtime.    . traZODone (DESYREL) 100 MG tablet Take 100 mg by mouth at bedtime.    . TURMERIC CURCUMIN PO Take 1 tablet by mouth 2 (two) times daily.      No results found for this or any previous visit (from the past 48 hour(s)). No results found.  Review of Systems  Constitutional: Negative.   HENT: Negative.   Eyes: Negative.   Respiratory:  Negative.   Cardiovascular: Negative.   Gastrointestinal: Negative.   Genitourinary: Negative.   Musculoskeletal: Positive for myalgias and back pain.  Skin: Negative.   Neurological: Positive for tingling and sensory change.  Endo/Heme/Allergies: Negative.   Psychiatric/Behavioral: Negative.     Blood pressure 132/47, pulse 62, temperature 97.6 F (36.4 C), temperature source Oral, resp. rate 20, height 5' 5.5" (1.664 m), weight 97.977 kg (216 lb), SpO2 96 %. Physical Exam  Constitutional: She is oriented to person, place, and time. She appears well-developed and well-nourished.  Eyes: Pupils are equal, round, and reactive to light.  Neck: Normal range of motion.  GI: Soft.  Neurological: She is alert and oriented to person, place, and time. GCS eye subscore is 4. GCS verbal subscore is 5. GCS motor subscore is 6.  Strength is 5 out of 5 in her iliopsoas, quads, hamstrings, gastric, and tibialis, and EHL.  Skin: Skin is warm and dry.     Assessment/Plan 70 year old female presents for decompression stabilization procedure at L4-5 and L5-S1.  [name] P 05/20/2015, 9:43 AM

## 2015-05-20 NOTE — Progress Notes (Signed)
Pt arrived to 5C12 from PACU. Patient is alert and oriented x4. Safety precaution reviewed with patient. No distress noted at this moment. Will continue to monitor.   Bjorn Pippin  05/20/2015 4:29 PM

## 2015-05-20 NOTE — Anesthesia Preprocedure Evaluation (Addendum)
Anesthesia Evaluation  Patient identified by MRN, date of birth, ID band Patient awake    Reviewed: Allergy & Precautions, NPO status , Patient's Chart, lab work & pertinent test results  History of Anesthesia Complications (+) PONV and history of anesthetic complications  Airway Mallampati: II  TM Distance: <3 FB Neck ROM: Full    Dental  (+) Teeth Intact, Dental Advisory Given   Pulmonary former smoker,    Pulmonary exam normal breath sounds clear to auscultation       Cardiovascular Exercise Tolerance: Good hypertension, Pt. on medications (-) angina(-) Past MI Normal cardiovascular exam Rhythm:Regular Rate:Normal     Neuro/Psych PSYCHIATRIC DISORDERS Depression negative neurological ROS     GI/Hepatic Neg liver ROS, GERD  Medicated and Controlled,  Endo/Other  Obesity  Renal/GU negative Renal ROS     Musculoskeletal  (+) Arthritis , Osteoarthritis,    Abdominal   Peds  Hematology negative hematology ROS (+)   Anesthesia Other Findings Day of surgery medications reviewed with the patient.  Reproductive/Obstetrics                            Anesthesia Physical Anesthesia Plan  ASA: II  Anesthesia Plan: General   Post-op Pain Management:    Induction: Intravenous  Airway Management Planned: Oral ETT and Video Laryngoscope Planned  Additional Equipment:   Intra-op Plan:   Post-operative Plan: Extubation in OR  Informed Consent: I have reviewed the patients History and Physical, chart, labs and discussed the procedure including the risks, benefits and alternatives for the proposed anesthesia with the patient or authorized representative who has indicated his/her understanding and acceptance.   Dental advisory given  Plan Discussed with: CRNA  Anesthesia Plan Comments: (Risks/benefits of general anesthesia discussed with patient including risk of damage to teeth, lips, gum,  and tongue, nausea/vomiting, allergic reactions to medications, and the possibility of heart attack, stroke and death.  All patient questions answered.  Patient wishes to proceed.  Will plan TIVA due to history of PONV.)        Anesthesia Quick Evaluation

## 2015-05-20 NOTE — Op Note (Signed)
Preoperative diagnosis: Grade 1 spondylolisthesis L4-5 severe lumbar spinal stenosis L4-5 with radiculopathy as well as severe spinal stenosis with radiculopathy L5-S1.  Postoperative diagnosis: Same  Procedure: #1 decompressive lumbar laminectomies L4-5 and L5-S1 in excess and requiring more work than would be needed with a standard interbody fusion. With complete medial facetectomies radical foraminotomies of the L4, L5 and S1 nerve roots.  #2 posterior lumbar interbody fusion L4-5 L5-S1 utilizing the globus rise expandable cage system packed with locally harvested autograft mixed with allostem morsels and BMP.  #3 cortical screw fixation using the globus Creo cortical screw set from L4-S1  #4 posterior lateral arthrodesis L4-S1 utilizing magnafuse  Surgeon: Jillyn Hidden Giovanna Kemmerer  Asst.: Sharlet Salina Ditty shoulder  Anesthesia: Gen.  EBL: Minimal  History of present illness: Patient is a pleasant 69 year old female who separate S worsening back and bilateral leg pain rating down and 4 L5 and S1 nerve root pattern workup revealed a grade 1 spondylolisthesis severe spinal stenosis at L4-5 severe agenda season stenosis at L5-S1. Due to patient's failure of conservative treatment imaging findings and progression of clinical syndrome I recommended decompressive laminectomy and fusion at L4-5 and L5-S1 with posterior lumbar interbody fusion at those levels. I extensively went over the risks and benefits of the operation with the patient as well as perioperative course expectations of outcome alternatives of surgery and she understood and agreed to proceed 4.  Operative procedure: Patient brought into the or was induced on general anesthesia positioned prone the Wilson frame back was prepped and draped in routine sterile fashion a midline incision was made after infiltration of 10 mL lidocaine with epi and Bovie light car was used to 10 subcutaneous tissues and subperiosteal dissections care lamina of L4, L5, S1.  Interoperative x-ray identified the appropriate level so attention second the cortical screws being placed at L4. Using combination of AP and lateral fluoroscopy entry points were selected at the 5 and 7:00 position holes were drilled and tapped probed and 6050 by 35 mm screws were placed at L4. After our these 2 screws were in good position confirmed by fluoroscopy I did complete central decompression with removal of spinous processes at L4 and L5 radical foraminotomies were carried out the L4-L5 and S1 nerve roots with complete facetectomies at this level. There was marked stenosis at the L4-5 disc space level from severe hourglass hypertrophy of the facets and ligament flavum. Aggressive undercutting of the facet complex and medial pedicle wall allowed the decompression of the L4 and L5 nerve roots. Marking laterally also didn't radical foraminotomy the S1 nerve root identify the lateral aspect of the disc aggressively under biting the superior tickling facet bilaterally. Epidural veins currently disc spaces were incised and cleanout pituitary rongeurs using sequential distraction I selected with an 11 distractor in Place at L4-5 810 and 17 expandable 15 cage and after adequate endplate preparation discectomy I inserted this cage and expanded approximate 3 turns. This opened up the disc space decompressing the 4 and V nerve roots respectively. Then removed the distractor went down L5 S1 1 similar fashion put in an 814 expandable cage then cleaned out the disc space from centrally and laterally in the contralateral side packed local autograft mixed with the alllsotem morsels and BMP centrally and then inserted the contralateral cage. After all the cages were confirmed to be in good position of place the cortical screws at L5 and S1 all screws excellent purchase inspected all the foramina to confirm patency no migration of graft material assembled  the rods and tightened up the knots decorticated the superior  lateral aspect of the facet complexes from L4-S1 and packed the bone in the back or magnafuse behind the rod along the super aspect of the facets. Then blade Gelfoam a tablet dura irrigated put vancomycin powder in place to drain and closed the wound in layers with interrupted Vicryls more vancomycin powder on the fascial layer subcuticular runner Dermabond benzo and Steri-Strips and sterile dressing applied patient recovered in stable condition. At the end the case all needle counts sponge counts were correct.

## 2015-05-20 NOTE — Progress Notes (Signed)
Utilization review completed.  

## 2015-05-20 NOTE — Transfer of Care (Signed)
Immediate Anesthesia Transfer of Care Note  Patient: Joyce Kelly  Procedure(s) Performed: Procedure(s) with comments: L/4-5, L5/S1 PLIF (N/A) - L/4-5, L5/S1 PLIF  Patient Location: PACU  Anesthesia Type:General  Level of Consciousness: awake, alert  and oriented  Airway & Oxygen Therapy: Patient Spontanous Breathing and Patient connected to face mask oxygen  Post-op Assessment: Report given to RN, Post -op Vital signs reviewed and stable and Patient moving all extremities  Post vital signs: Reviewed and stable  Last Vitals:  Filed Vitals:   05/20/15 1424  BP:   Pulse:   Temp: 36.7 C  Resp:     Complications: No apparent anesthesia complications

## 2015-05-20 NOTE — Anesthesia Procedure Notes (Signed)
Procedure Name: Intubation Date/Time: 05/20/2015 9:56 AM Performed by: Orvilla Fus A Pre-anesthesia Checklist: Patient identified, Timeout performed, Emergency Drugs available, Suction available and Patient being monitored Patient Re-evaluated:Patient Re-evaluated prior to inductionOxygen Delivery Method: Circle system utilized Preoxygenation: Pre-oxygenation with 100% oxygen Intubation Type: IV induction Ventilation: Mask ventilation without difficulty Grade View: Grade I Tube size: 7.5 mm Number of attempts: 1 Airway Equipment and Method: Rigid stylet and Video-laryngoscopy Placement Confirmation: ETT inserted through vocal cords under direct vision,  breath sounds checked- equal and bilateral and positive ETCO2 Secured at: 21 cm Tube secured with: Tape Dental Injury: Teeth and Oropharynx as per pre-operative assessment  Comments: Mallampati III preop, small mouth opening, morbid obesity- glidescope used without issue.

## 2015-05-20 NOTE — Anesthesia Postprocedure Evaluation (Signed)
  Anesthesia Post-op Note  Patient: Joyce Kelly  Procedure(s) Performed: Procedure(s) (LRB): L/4-5, L5/S1 PLIF (N/A)  Patient Location: PACU  Anesthesia Type: General  Level of Consciousness: awake and alert   Airway and Oxygen Therapy: Patient Spontanous Breathing  Post-op Pain: mild  Post-op Assessment: Post-op Vital signs reviewed, Patient's Cardiovascular Status Stable, Respiratory Function Stable, Patent Airway and No signs of Nausea or vomiting  Last Vitals:  Filed Vitals:   05/20/15 1619  BP: 141/64  Pulse: 66  Temp: 36.5 C  Resp: 15    Post-op Vital Signs: stable   Complications: No apparent anesthesia complications

## 2015-05-21 MED FILL — Heparin Sodium (Porcine) Inj 1000 Unit/ML: INTRAMUSCULAR | Qty: 30 | Status: AC

## 2015-05-21 MED FILL — Sodium Chloride IV Soln 0.9%: INTRAVENOUS | Qty: 2000 | Status: AC

## 2015-05-21 NOTE — Evaluation (Signed)
Physical Therapy Evaluation Patient Details Name: Joyce Kelly MRN: [id number] DOB: October 20, 1945 Today's Date: 05/21/2015   History of Present Illness  Patient is a 69 y.o. 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  She is now s/p decompressive lumbar laminectomies and PLIF L4-5 and L5-S1.  Clinical Impression  Patient presents with decreased independence with mobility due to deficits listed in PT problem list.  She will benefit from skilled PT in the acute setting to allow return home with spouse assist.  Likely to progress with no initially follow up needs.      Follow Up Recommendations No PT follow up    Equipment Recommendations  None recommended by PT    Recommendations for Other Services       Precautions / Restrictions Precautions Precautions: Back Precaution Booklet Issued: Yes (comment) Required Braces or Orthoses: Spinal Brace Spinal Brace: Lumbar corset;Applied in sitting position      Mobility  Bed Mobility Overal bed mobility: Needs Assistance Bed Mobility: Rolling;Sidelying to Sit Rolling: Supervision Sidelying to sit: Min guard       General bed mobility comments: rolls with rail, cues for technique, assist and cues for sequencing with side to sit  Transfers Overall transfer level: Needs assistance Equipment used: Rolling walker (2 wheeled) Transfers: Sit to/from Stand Sit to Stand: Min guard         General transfer comment: cues for precautions especially stand to sit and for hand placement  Ambulation/Gait Ambulation/Gait assistance: Supervision;Min guard Ambulation Distance (Feet): 160 Feet Assistive device: Rolling walker (2 wheeled) Gait Pattern/deviations: Step-to pattern     General Gait Details: safe technique even on turns with RW  Stairs            Wheelchair Mobility    Modified Rankin (Stroke Patients Only)       Balance Overall balance assessment:  No apparent balance deficits (not formally assessed)                                           Pertinent Vitals/Pain Pain Assessment: 0-10 Pain Score: 5  Pain Location: back Pain Descriptors / Indicators: Sore Pain Intervention(s): Monitored during session;Repositioned    Home Living Family/patient expects to be discharged to:: Private residence Living Arrangements: Spouse/significant other Available Help at Discharge: Family Type of Home: House Home Access: Stairs to enter Entrance Stairs-Rails: None Entrance Stairs-Number of Steps: 6 Home Layout: Two level;Able to live on main level with bedroom/bathroom Home Equipment: None Additional Comments: plans to borrow equipment from friend (RW, shower chair and 3:1)    Prior Function Level of Independence: Independent               Hand Dominance        Extremity/Trunk Assessment               Lower Extremity Assessment: Overall WFL for tasks assessed         Communication   Communication: No difficulties  Cognition Arousal/Alertness: Awake/alert Behavior During Therapy: WFL for tasks assessed/performed Overall Cognitive Status: Within Functional Limits for tasks assessed                      General Comments      Exercises        Assessment/Plan    PT Assessment Patient needs continued PT services  PT Diagnosis Acute pain;Difficulty walking   PT Problem List Decreased knowledge of precautions;Decreased mobility;Pain;Decreased knowledge of use of DME;Decreased safety awareness  PT Treatment Interventions DME instruction;Gait training;Stair training;Functional mobility training;Patient/family education;Therapeutic activities;Therapeutic exercise   PT Goals (Current goals can be found in the Care Plan section) Acute Rehab PT Goals Patient Stated Goal: To return home, independent PT Goal Formulation: With patient Time For Goal Achievement: 05/25/15 Potential to Achieve  Goals: Good    Frequency Min 6X/week   Barriers to discharge        Co-evaluation               End of Session Equipment Utilized During Treatment: Back brace Activity Tolerance: Patient tolerated treatment well Patient left: in chair;with call bell/phone within reach Nurse Communication: Mobility status         Time: 9604-5409 PT Time Calculation (min) (ACUTE ONLY): 32 min   Charges:   PT Evaluation $Initial PT Evaluation Tier I: 1 Procedure PT Treatments $Gait Training: 8-22 mins   PT G Codes:        WYNN,CYNDI 06-20-2015, 9:53 AM  Sheran Lawless, PT [phone] 2015-06-20

## 2015-05-21 NOTE — Progress Notes (Signed)
Occupational Therapy Evaluation Patient Details Name: Joyce Kelly MRN: [id number] DOB: 11-02-1945 Today's Date: 05/21/2015    History of Present Illness Patient is a 69 y.o. 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  She is now s/p decompressive lumbar laminectomies and PLIF L4-5 and L5-S1.   Clinical Impression   Pt admitted with the above diagnoses and presents with below problem list. Pt will benefit from continued acute OT to address the below listed deficits and maximize independence with BADLs prior to d/c home. PTA pt was independent with ADLs. Pt is currently min guard to min A for LB ADLs. ADL education provided to pt and spouse. OT to continue to follow acutely.     Follow Up Recommendations  Supervision/Assistance - 24 hour;No OT follow up    Equipment Recommendations  None recommended by OT    Recommendations for Other Services       Precautions / Restrictions Precautions Precautions: Back Precaution Booklet Issued: Yes (comment) Precaution Comments: reviewed Required Braces or Orthoses: Spinal Brace Spinal Brace: Lumbar corset;Applied in sitting position      Mobility Bed Mobility Overal bed mobility: Needs Assistance Bed Mobility: Rolling;Sit to Sidelying Rolling: Supervision Sidelying to sit: Min guard     Sit to sidelying: Min assist General bed mobility comments: min A to advance BLE onto bed, educated spouse on technique to assist at home. Pt reports she plans to sleep in recliner mostly.   Transfers Overall transfer level: Needs assistance Equipment used: Rolling walker (2 wheeled) Transfers: Sit to/from Stand Sit to Stand: Min guard         General transfer comment: from recliner and EOB, cues for technique    Balance Overall balance assessment: Needs assistance Sitting-balance support: No upper extremity supported;Feet supported Sitting balance-Leahy Scale: Good      Standing balance support: Bilateral upper extremity supported;During functional activity Standing balance-Leahy Scale: Fair Standing balance comment: external support/rw for balance this session                            ADL Overall ADL's : Needs assistance/impaired Eating/Feeding: Set up;Sitting   Grooming: Min guard;Standing;Cueing for compensatory techniques   Upper Body Bathing: Min guard;Sitting   Lower Body Bathing: Minimal assistance;Sit to/from stand Lower Body Bathing Details (indicate cue type and reason): difficulty accessing feet in sitting position, educated on AE Upper Body Dressing : Set up;Cueing for compensatory techniques   Lower Body Dressing: Minimal assistance;Sit to/from stand Lower Body Dressing Details (indicate cue type and reason): difficulty accessing feet in sitting position, educated on AE Toilet Transfer: Min guard;Ambulation;RW   Toileting- Clothing Manipulation and Hygiene: Minimal assistance;Sit to/from stand;Cueing for compensatory techniques Toileting - Clothing Manipulation Details (indicate cue type and reason): educated on AE and technique Tub/ Shower Transfer: Min guard;Walk-in shower;3 in Scientist, water quality Details (indicate cue type and reason): plans to borrow shower seat from friend Functional mobility during ADLs: Min guard;Rolling walker General ADL Comments: Pt completed household distance functional mobility and bed mobility as detailed above and below. Pt with difficulty accessing feet in sitting position, educated on AE. ADL education provided to pt and spouse.      Vision     Perception     Praxis      Pertinent Vitals/Pain Pain Assessment: 0-10 Pain Score: 5  Pain Location: back Pain Descriptors / Indicators: Sore Pain Intervention(s): Limited activity within patient's tolerance;Monitored during  session;Patient requesting pain meds-RN notified;Repositioned     Hand Dominance      Extremity/Trunk Assessment Upper Extremity Assessment Upper Extremity Assessment: Overall WFL for tasks assessed   Lower Extremity Assessment Lower Extremity Assessment: Defer to PT evaluation   Cervical / Trunk Assessment Cervical / Trunk Assessment:  (s/p back surgery)   Communication Communication Communication: No difficulties   Cognition Arousal/Alertness: Awake/alert Behavior During Therapy: WFL for tasks assessed/performed Overall Cognitive Status: Within Functional Limits for tasks assessed                     General Comments       Exercises       Shoulder Instructions      Home Living Family/patient expects to be discharged to:: Private residence Living Arrangements: Spouse/significant other Available Help at Discharge: Family Type of Home: House Home Access: Stairs to enter Secretary/administrator of Steps: 6 Entrance Stairs-Rails: None Home Layout: Two level;Able to live on main level with bedroom/bathroom     Bathroom Shower/Tub: Producer, television/film/video: Standard     Home Equipment: None   Additional Comments: plans to borrow equipment from friend (RW, shower chair and 3:1)      Prior Functioning/Environment Level of Independence: Independent             OT Diagnosis: Acute pain   OT Problem List: Impaired balance (sitting and/or standing);Decreased knowledge of use of DME or AE;Decreased knowledge of precautions;Pain   OT Treatment/Interventions: Self-care/ADL training;DME and/or AE instruction;Therapeutic activities;Patient/family education;Balance training    OT Goals(Current goals can be found in the care plan section) Acute Rehab OT Goals Patient Stated Goal: To return home, independent OT Goal Formulation: With patient/family Time For Goal Achievement: 05/28/15 Potential to Achieve Goals: Good ADL Goals Pt Will Perform Grooming: with modified independence;with adaptive equipment;standing Pt Will Perform Lower Body  Bathing: with modified independence;with adaptive equipment;sit to/from stand Pt Will Perform Lower Body Dressing: with modified independence;with adaptive equipment;sit to/from stand Pt Will Transfer to Toilet: with modified independence;ambulating;bedside commode Pt Will Perform Toileting - Clothing Manipulation and hygiene: with modified independence;with adaptive equipment;sitting/lateral leans;sit to/from stand Pt Will Perform Tub/Shower Transfer: Shower transfer;with supervision;ambulating;shower seat;rolling walker Additional ADL Goal #1: Pt will complete bed mobility at min guard level to prepare for OOB ADLs.   OT Frequency: Min 2X/week   Barriers to D/C:            Co-evaluation              End of Session Equipment Utilized During Treatment: Gait belt;Rolling walker;Back brace Nurse Communication: Patient requests pain meds  Activity Tolerance: Patient tolerated treatment well Patient left: in bed;with call bell/phone within reach;with family/visitor present;with SCD's reapplied   Time: 1610-9604 OT Time Calculation (min): 24 min Charges:  OT General Charges $OT Visit: 1 Procedure OT Evaluation $Initial OT Evaluation Tier I: 1 Procedure OT Treatments $Self Care/Home Management : 8-22 mins G-Codes:    Pilar Grammes May 25, 2015, 11:51 AM

## 2015-05-21 NOTE — Progress Notes (Signed)
Pt ambulated with rolling walker, brace on and aligned to bathroom. No noted distress. Pt back to bed. Call bell within reach.

## 2015-05-21 NOTE — Progress Notes (Signed)
Subjective: Patient reports Doing great no leg pain minimal back pain  Objective: Vital signs in last 24 hours: Temp:  [97.5 F (36.4 C)-98.3 F (36.8 C)] 98.3 F (36.8 C) (09/15 0549) Pulse Rate:  [64-83] 67 (09/15 0549) Resp:  [7-21] 16 (09/15 0549) BP: (104-141)/(44-85) 115/46 mmHg (09/15 0549) SpO2:  [92 %-99 %] 92 % (09/15 0549) Weight:  [97.977 kg (216 lb)] 97.977 kg (216 lb) (09/14 0801)  Intake/Output from previous day: 09/14 0701 - 09/15 0700 In: 2965 [Kelly.O.:480; I.V.:2350; Blood:135] Out: 2555 [Urine:1915; Drains:190; Blood:450] Intake/Output this shift:    Strength out of 5 wound clean dry and intact  Lab Results: No results for input(s): WBC, HGB, HCT, PLT in the last 72 hours. BMET No results for input(s): NA, K, CL, CO2, GLUCOSE, BUN, CREATININE, CALCIUM in the last 72 hours.  Studies/Results: Dg Lumbar Spine 2-3 Views  05/20/2015   CLINICAL DATA:  Posterior fusion from L4-S1.  EXAM: DG C-ARM 61-120 MIN; LUMBAR SPINE - 2-3 VIEW  COMPARISON:  None.  FLUOROSCOPY TIME:  1 min 28 seconds.  FINDINGS: Two intraoperative fluoroscopic images of the lower lumbar spine were obtained. These demonstrate the patient be status post posterior fusion of L4-5 and L5-S1 with bilateral intrapedicular screw placement and interbody fusion. Good alignment of the vertebral bodies is noted.  IMPRESSION: Status post posterior surgical fusion of L4-5 and L5-S1.   Electronically Signed   By: Lupita Raider, M.D.   On: 05/20/2015 13:59   Dg C-arm 61-120 Min  05/20/2015   CLINICAL DATA:  Posterior fusion from L4-S1.  EXAM: DG C-ARM 61-120 MIN; LUMBAR SPINE - 2-3 VIEW  COMPARISON:  None.  FLUOROSCOPY TIME:  1 min 28 seconds.  FINDINGS: Two intraoperative fluoroscopic images of the lower lumbar spine were obtained. These demonstrate the patient be status post posterior fusion of L4-5 and L5-S1 with bilateral intrapedicular screw placement and interbody fusion. Good alignment of the vertebral bodies  is noted.  IMPRESSION: Status post posterior surgical fusion of L4-5 and L5-S1.   Electronically Signed   By: Lupita Raider, M.D.   On: 05/20/2015 13:59    Assessment/Plan: Mobilized day with physical and occupational therapy. Continue Hemovac drain.  LOS: 1 day     Joyce Kelly 05/21/2015, 7:48 AM

## 2015-05-21 NOTE — Clinical Social Work Note (Signed)
CSW Consult Acknowledged:   CSW received a consult for SNF placement.  Per PT the appropriate level of care is home. CSW will sign off.       Sonnet Rizor, MSW, LCSWA [phone]  

## 2015-05-22 MED ORDER — DEXAMETHASONE SODIUM PHOSPHATE 10 MG/ML IJ SOLN
10.0000 mg | Freq: Once | INTRAMUSCULAR | Status: AC
  Administered 2015-05-22: 10 mg via INTRAVENOUS
  Filled 2015-05-22: qty 1

## 2015-05-22 MED ORDER — DEXAMETHASONE SODIUM PHOSPHATE 10 MG/ML IJ SOLN: 10.0000 mg | Freq: Once | INTRAMUSCULAR | Status: DC

## 2015-05-22 NOTE — Progress Notes (Signed)
Physical Therapy Treatment Patient Details Name: Joyce Kelly MRN: [id number] DOB: November 16, 1945 Today's Date: 05/22/2015    History of Present Illness Patient is a 69 y.o. 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  She is now s/p decompressive lumbar laminectomies and PLIF L4-5 and L5-S1.    PT Comments    Patient limited with pain this pm and needed assist for back to bed, but was able to negotiate steps with best tolerance with RW.  Will see in AM if not d/c today for further sit to sidelying practice and review of steps if needed.  Follow Up Recommendations  No PT follow up     Equipment Recommendations  None recommended by PT    Recommendations for Other Services       Precautions / Restrictions Precautions Precautions: Back Required Braces or Orthoses: Spinal Brace Spinal Brace: Lumbar corset;Applied in sitting position    Mobility  Bed Mobility Overal bed mobility: Needs Assistance   Rolling: Supervision Sidelying to sit: Supervision     Sit to sidelying: Min assist General bed mobility comments: use of rail to roll and come upright, assist for feet onto edge of bed for sit to sidelying  Transfers Overall transfer level: Needs assistance Equipment used: Rolling walker (2 wheeled) Transfers: Sit to/from Stand Sit to Stand: Supervision         General transfer comment: with safe technique, S for follow through with back precautions  Ambulation/Gait Ambulation/Gait assistance: Supervision Ambulation Distance (Feet): 220 Feet Assistive device: Rolling walker (2 wheeled) Gait Pattern/deviations: Step-through pattern;Decreased stride length;Antalgic     General Gait Details: slow and obviously in pain, but no unsafe techniques   Stairs Stairs: Yes Stairs assistance: Min assist Stair Management: Step to pattern;Backwards;Forwards;With walker Number of Stairs: 2 General stair comments:  2 trials for different technique; first with HHA, cues for technique, then with walker reverse technique and min assist for walker stabilization  Wheelchair Mobility    Modified Rankin (Stroke Patients Only)       Balance Overall balance assessment: Needs assistance           Standing balance-Leahy Scale: Fair                      Cognition Arousal/Alertness: Awake/alert Behavior During Therapy: WFL for tasks assessed/performed Overall Cognitive Status: Within Functional Limits for tasks assessed                      Exercises      General Comments        Pertinent Vitals/Pain Pain Score: 10-Worst pain ever Pain Location: left side of back with ambulation Pain Descriptors / Indicators: Sore Pain Intervention(s): Repositioned;RN gave pain meds during session    Home Living                      Prior Function            PT Goals (current goals can now be found in the care plan section) Progress towards PT goals: Progressing toward goals    Frequency  Min 6X/week    PT Plan Current plan remains appropriate    Co-evaluation             End of Session Equipment Utilized During Treatment: Back brace Activity Tolerance: Patient limited by pain Patient left: with call bell/phone within reach;in bed     Time: 1350-1419 PT  Time Calculation (min) (ACUTE ONLY): 29 min  Charges:  $Gait Training: 8-22 mins $Therapeutic Activity: 8-22 mins                    G Codes:      WYNN,CYNDI Jun 11, 2015, 2:27 PM  Sheran Lawless, PT [phone] 06/11/15

## 2015-05-22 NOTE — Progress Notes (Signed)
Occupational Therapy Treatment Patient Details Name: Joyce Kelly MRN: [id number] DOB: [birth year] Today's Date: 05/22/2015    History of present illness Patient is a 69 y.o. 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  She is now s/p decompressive lumbar laminectomies and PLIF L4-5 and L5-S1.   OT comments  Focus of session on instruction in use of AE for LB ADL, bed mobility, toileting and standing grooming.  Pt verbalizing understanding of back precautions related to ADL and IADL. Supervised for mobility with RW to sink and bathroom.  Follow Up Recommendations  Supervision/Assistance - 24 hour;No OT follow up    Equipment Recommendations  None recommended by OT    Recommendations for Other Services      Precautions / Restrictions Precautions Precautions: Back Precaution Comments: Pt able to state 2/3, cues for "twisting" Required Braces or Orthoses: Spinal Brace Spinal Brace: Lumbar corset;Applied in sitting position       Mobility Bed Mobility Overal bed mobility: Needs Assistance Bed Mobility: Sidelying to Sit;Rolling;Sit to Sidelying Rolling: Supervision Sidelying to sit: Supervision     Sit to sidelying: Min assist General bed mobility comments: bed flat, no rail, assisted for LEs back in bed, educated in log roll technique  Transfers Overall transfer level: Needs assistance Equipment used: Rolling walker (2 wheeled)   Sit to Stand: Supervision              Balance                                   ADL Overall ADL's : Needs assistance/impaired     Grooming: Supervision/safety;Standing;Oral care;Wash/dry hands;Wash/dry face Grooming Details (indicate cue type and reason): instructed in 2 cup method for toothbrushing       Lower Body Bathing Details (indicate cue type and reason): recommended long bath sponge Upper Body Dressing : Set up;Sitting Upper Body Dressing  Details (indicate cue type and reason): including brace Lower Body Dressing: Supervision/safety;With adaptive equipment;Sit to/from stand Lower Body Dressing Details (indicate cue type and reason): educated in use of reacher, sock aide, long shoe horn Toilet Transfer: Supervision/safety;Ambulation;RW;Comfort height toilet   Toileting- Clothing Manipulation and Hygiene: Supervision/safety;Cueing for back precautions;Sit to/from stand Toileting - Clothing Manipulation Details (indicate cue type and reason): instructed in use of toilet tongs and wet wipes as needed     Functional mobility during ADLs: Supervision/safety;Rolling walker General ADL Comments: Pt can borrow her mom's reacher.  Instructed pt in depth in precautions related to IADL.      Vision                     Perception     Praxis      Cognition   Behavior During Therapy: WFL for tasks assessed/performed Overall Cognitive Status: Within Functional Limits for tasks assessed                       Extremity/Trunk Assessment               Exercises     Shoulder Instructions       General Comments      Pertinent Vitals/ Pain       Pain Assessment: Faces Faces Pain Scale: Hurts little more Pain Location: back Pain Descriptors / Indicators: Sore Pain Intervention(s): Monitored during session;Premedicated before session;Repositioned  Home Living  Prior Functioning/Environment              Frequency Min 2X/week     Progress Toward Goals  OT Goals(current goals can now be found in the care plan section)  Progress towards OT goals: Progressing toward goals  Acute Rehab OT Goals Patient Stated Goal: To return home, independent Time For Goal Achievement: 05/28/15 Potential to Achieve Goals: Good  Plan Discharge plan remains appropriate    Co-evaluation                 End of Session Equipment Utilized During  Treatment: Gait belt;Rolling walker;Back brace   Activity Tolerance Patient tolerated treatment well   Patient Left in bed;with call bell/phone within reach;with bed alarm set   Nurse Communication          Time: [phone] OT Time Calculation (min): 42 min  Charges: OT General Charges $OT Visit: 1 Procedure OT Treatments $Self Care/Home Management : 38-52 mins  Evern Bio 05/22/2015, 11:05 AM  [phone]

## 2015-05-22 NOTE — Progress Notes (Signed)
Subjective: Patient reports Doing well no leg pain condition of severe back pain last night somewhat better this morning  Objective: Vital signs in last 24 hours: Temp:  [98 F (36.7 C)-98.8 F (37.1 C)] 98.4 F (36.9 C) (09/16 0543) Pulse Rate:  [62-76] 64 (09/16 0543) Resp:  [16-20] 20 (09/16 0543) BP: (90-114)/(37-53) 104/46 mmHg (09/16 0543) SpO2:  [94 %-96 %] 96 % (09/16 0543)  Intake/Output from previous day: 09/15 0701 - 09/16 0700 In: -  Out: 295 [Drains:295] Intake/Output this shift:    Strength out of 5 wound clean dry and intact  Lab Results: No results for input(s): WBC, HGB, HCT, PLT in the last 72 hours. BMET No results for input(s): NA, K, CL, CO2, GLUCOSE, BUN, CREATININE, CALCIUM in the last 72 hours.  Studies/Results: Dg Lumbar Spine 2-3 Views  05/20/2015   CLINICAL DATA:  Posterior fusion from L4-S1.  EXAM: DG C-ARM 61-120 MIN; LUMBAR SPINE - 2-3 VIEW  COMPARISON:  None.  FLUOROSCOPY TIME:  1 min 28 seconds.  FINDINGS: Two intraoperative fluoroscopic images of the lower lumbar spine were obtained. These demonstrate the patient be status post posterior fusion of L4-5 and L5-S1 with bilateral intrapedicular screw placement and interbody fusion. Good alignment of the vertebral bodies is noted.  IMPRESSION: Status post posterior surgical fusion of L4-5 and L5-S1.   Electronically Signed   By: Lupita Raider, M.D.   On: 05/20/2015 13:59   Dg C-arm 61-120 Min  05/20/2015   CLINICAL DATA:  Posterior fusion from L4-S1.  EXAM: DG C-ARM 61-120 MIN; LUMBAR SPINE - 2-3 VIEW  COMPARISON:  None.  FLUOROSCOPY TIME:  1 min 28 seconds.  FINDINGS: Two intraoperative fluoroscopic images of the lower lumbar spine were obtained. These demonstrate the patient be status post posterior fusion of L4-5 and L5-S1 with bilateral intrapedicular screw placement and interbody fusion. Good alignment of the vertebral bodies is noted.  IMPRESSION: Status post posterior surgical fusion of L4-5 and  L5-S1.   Electronically Signed   By: Lupita Raider, M.D.   On: 05/20/2015 13:59    Assessment/Plan: Continue to mobilize with physical and occupational therapy. I'll give her one dose of Decadron to help with severe back inflammation.  LOS: 2 days     CRAM,GARY P 05/22/2015, 7:30 AM

## 2015-05-23 MED ORDER — OXYCODONE-ACETAMINOPHEN 5-325 MG PO TABS: 1.0000 | ORAL_TABLET | ORAL | Status: DC | PRN

## 2015-05-23 MED ORDER — POLYETHYLENE GLYCOL 3350 17 G PO PACK
17.0000 g | PACK | Freq: Every day | ORAL | Status: DC | PRN
  Administered 2015-05-23: 17 g via ORAL
  Filled 2015-05-23: qty 1

## 2015-05-23 NOTE — Discharge Instructions (Signed)
Spinal Fusion °Care After °Refer to this sheet in the next few weeks. These instructions provide you with information on caring for yourself after your procedure. Your caregiver may also give you more specific instructions. Your treatment has been planned according to current medical practices, but problems sometimes occur. Call your caregiver if you have any problems or questions after your procedure. °HOME CARE INSTRUCTIONS  °· Take whatever pain medicine has been prescribed by your caregiver. Do not take over-the-counter pain medicine unless directed otherwise by your caregiver. °· Do not drive if you are taking narcotic pain medicines. °· Change your bandage (dressing) if necessary or as directed by your caregiver. °· Do not get your surgical cut (incision) wet. After a few days you may take quick showers (rather than baths), but keep your incision clean and dry. Covering the incision with plastic wrap while you shower should keep your incision dry. A few weeks after surgery, once your incision has healed and your caregiver says it is okay, you can take baths or go swimming. °· If you have been prescribed medicine to prevent your blood from clotting, follow the directions carefully. °· Check the area around your incision often. Look for redness and swelling. Also, look for anything leaking from your wound. You can use a mirror or have a family member inspect your incision if it is in a place where it is difficult for you to see. °· Ask your caregiver what activities you should avoid and for how long. °· Walk as much as possible. °· Do not lift anything heavier than 10 pounds (4.5 kilograms) until your caregiver says it is safe. °· Do not twist or bend for a few weeks. Try not to pull on things. Avoid sitting for long periods of time. Change positions at least every hour. °· Ask your caregiver what kinds of exercise you should do to make your back stronger and when you should begin doing these exercises. °SEEK  IMMEDIATE MEDICAL CARE IF:  °· Pain suddenly becomes much worse. °· The incision area is red, swollen, bleeding, or leaking fluid. °· Your legs or feet become increasingly painful, numb, weak, or swollen. °· You have trouble controlling urination or bowel movements. °· You have trouble breathing. °· You have chest pain. °· You have a fever. °MAKE SURE YOU: °· Understand these instructions. °· Will watch your condition. °· Will get help right away if you are not doing well or get worse. °Document Released: 03/11/2005 Document Revised: 11/14/2011 Document Reviewed: 11/04/2010 °[employer]® Patient Information ©2015 [employer]. This information is not intended to replace advice given to you by your health care provider. Make sure you discuss any questions you have with your health care provider. ° °

## 2015-05-23 NOTE — Progress Notes (Signed)
Physical Therapy Treatment Patient Details Name: Joyce Kelly MRN: [id number] DOB: September 18, 1945 Today's Date: 05/23/2015    History of Present Illness Patient is a 69 y.o. 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  She is now s/p decompressive lumbar laminectomies and PLIF L4-5 and L5-S1.    PT Comments    Pt alert, oriented, and willing to participate with PT.  Pt demonstrates ability to safely perform all functional mobility with modified independence. She has been educated regarding spinal precautions, use of brace, car transfer technique, and safe activity at home with pt demonstrating good understanding.  No further acute PT or PT follow up required.  Follow Up Recommendations  No PT follow up     Equipment Recommendations  None recommended by PT    Recommendations for Other Services       Precautions / Restrictions Precautions Precautions: Back Precaution Booklet Issued: Yes (comment) Precaution Comments: reviewed; pt with good recall and compliance Required Braces or Orthoses: Spinal Brace Spinal Brace: Lumbar corset;Applied in sitting position    Mobility  Bed Mobility Overal bed mobility: Needs Assistance Bed Mobility: Rolling;Sit to Sidelying Rolling: Modified independent (Device/Increase time) Sidelying to sit:  (pt received sitting in chair)     Sit to sidelying: Min assist (for LE management) General bed mobility comments: good compliance of precautions with bed mobility  Transfers Overall transfer level: Needs assistance Equipment used: Rolling walker (2 wheeled) Transfers: Sit to/from Stand Sit to Stand: Modified independent (Device/Increase time)         General transfer comment: able to safely perform without cueing  Ambulation/Gait Ambulation/Gait assistance: Modified independent (Device/Increase time) Ambulation Distance (Feet): 220 Feet Assistive device: Rolling walker (2  wheeled) Gait Pattern/deviations: Step-through pattern;Decreased stride length   Gait velocity interpretation: Below normal speed for age/gender General Gait Details: slow and somewhat guarded with ambulation   Stairs Stairs: Yes Stairs assistance: Modified independent (Device/Increase time) Stair Management: Step to pattern;One rail Right Number of Stairs: 5 General stair comments: pt notes wall on R side at home she can use for support; pt daughter and husband also able to assist  Wheelchair Mobility    Modified Rankin (Stroke Patients Only)       Balance Overall balance assessment: Needs assistance Sitting-balance support: No upper extremity supported;Feet supported Sitting balance-Leahy Scale: Good     Standing balance support: Bilateral upper extremity supported;During functional activity Standing balance-Leahy Scale: Fair Standing balance comment:                      Cognition Arousal/Alertness: Awake/alert Behavior During Therapy: WFL for tasks assessed/performed Overall Cognitive Status: Within Functional Limits for tasks assessed                      Exercises      General Comments General comments (skin integrity, edema, etc.): pt educated on spinal precautions with good recall, car transfers, activity at home      Pertinent Vitals/Pain Pain Assessment: Faces Faces Pain Scale: Hurts little more Pain Location: back; L > R Pain Descriptors / Indicators: Sore Pain Intervention(s): Monitored during session;Repositioned    Home Living                      Prior Function            PT Goals (current goals can now be found in the care plan section) Acute Rehab PT  Goals PT Goal Formulation: With patient Time For Goal Achievement: 05/25/15 Potential to Achieve Goals: Good Progress towards PT goals: Goals met/education completed, patient discharged from PT    Frequency       PT Plan Current plan remains appropriate     Co-evaluation             End of Session Equipment Utilized During Treatment: Back brace;Gait belt Activity Tolerance: Patient tolerated treatment well Patient left: with call bell/phone within reach;in bed     Time: 4656-8127 PT Time Calculation (min) (ACUTE ONLY): 22 min  Charges:  $Gait Training: 8-22 mins                    G CodesLorita Officer 06-01-2015, 12:37 PM   Lorita Officer, SPT

## 2015-05-23 NOTE — Discharge Summary (Signed)
Physician Discharge Summary  Patient ID: Joyce Kelly MRN: [id number] DOB/AGE: 1946-08-09 69 y.o.  Admit date: 05/20/2015 Discharge date: 05/23/2015  Admission Diagnoses:  Grade 1 spondylolisthesis L4-5 severe lumbar spinal stenosis L4-5 with radiculopathy as well as severe spinal stenosis with radiculopathy L5-S1.  Discharge Diagnoses:  Grade 1 spondylolisthesis L4-5 severe lumbar spinal stenosis L4-5 with radiculopathy as well as severe spinal stenosis with radiculopathy L5-S1. Active Problems:   Spinal stenosis at L4-L5 level   Discharged Condition: good  Hospital Course: Patient admitted by Dr. Wynetta Emery, who performed an L4-5 and L5-S1 lumbar decompression and arthrodesis. Patient is done well following surgery. She is up and ambulating actively in the halls. She is asking to be discharged to home. Her Hemovac drain was removed, and she's been given instructions regarding wound care and activities. She is to return for follow-up with Dr. Wynetta Emery in 10 days.  Discharge Exam: Blood pressure 117/48, pulse 60, temperature 98.2 F (36.8 C), temperature source Oral, resp. rate 20, height 5' 5.5" (1.664 m), weight 97.977 kg (216 lb), SpO2 98 %.  Disposition: Home    Medication List    TAKE these medications        Calcium-Magnesium-Zinc Tabs  Take 1 tablet by mouth daily.     diphenhydrAMINE 25 mg capsule  Commonly known as:  BENADRYL  Take 25 mg by mouth at bedtime as needed for itching.     escitalopram 10 MG tablet  Commonly known as:  LEXAPRO  Take 10 mg by mouth daily.     gabapentin 100 MG capsule  Commonly known as:  NEURONTIN  Take 100 mg by mouth 3 (three) times daily.     HYDROcodone-acetaminophen 10-325 MG per tablet  Commonly known as:  NORCO  Take 1 tablet by mouth every 6 (six) hours as needed for severe pain.     hydrocortisone cream 0.5 %  Apply 1 application topically at bedtime as needed for itching.     meloxicam 15 MG tablet  Commonly known as:  MOBIC  Take 15  mg by mouth daily as needed for pain.     olmesartan-hydrochlorothiazide 40-12.5 MG per tablet  Commonly known as:  BENICAR HCT  Take 1 tablet by mouth daily.     oxyCODONE-acetaminophen 5-325 MG per tablet  Commonly known as:  PERCOCET/ROXICET  Take 1-2 tablets by mouth every 4 (four) hours as needed for moderate pain.     Potassium Gluconate 550 MG Tabs  Take 1 tablet by mouth 3 (three) times daily.     rOPINIRole 1 MG tablet  Commonly known as:  REQUIP  Take 1 mg by mouth 2 (two) times daily.     TART CHERRY ADVANCED PO  Take 1,200 mg by mouth 2 (two) times daily.     tiZANidine 2 MG tablet  Commonly known as:  ZANAFLEX  Take 2 mg by mouth at bedtime.     traZODone 100 MG tablet  Commonly known as:  DESYREL  Take 100 mg by mouth at bedtime.     TURMERIC CURCUMIN PO  Take 1 tablet by mouth 2 (two) times daily.         SignedHewitt Shorts 05/23/2015, 9:10 AM

## 2015-05-23 NOTE — Discharge Summary (Signed)
Physical Therapy Discharge Patient Details Name: Joyce Kelly MRN: [id number] DOB: 10-30-1945 Today's Date: 05/23/2015 Time: 7681-1572 PT Time Calculation (min) (ACUTE ONLY): 22 min  Patient discharged from PT services secondary to goals met and no further PT needs identified.  Please see latest therapy progress note for current level of functioning and progress toward goals.    Progress and discharge plan discussed with patient and/or caregiver: Patient/Caregiver agrees with plan  GP     Lorita Officer 05/23/2015, 12:45 PM   Lorita Officer, SPT 05/23/2015 12:45 PM

## 2015-05-23 NOTE — Progress Notes (Signed)
Pt d/c to home by car with family. Assessment stable. Prescription given. All questions answered 

## 2015-06-02 ENCOUNTER — Other Ambulatory Visit (HOSPITAL_BASED_OUTPATIENT_CLINIC_OR_DEPARTMENT_OTHER): Payer: Self-pay | Admitting: Neurosurgery

## 2015-06-02 DIAGNOSIS — M5137 Other intervertebral disc degeneration, lumbosacral region: Secondary | ICD-10-CM

## 2015-06-19 ENCOUNTER — Ambulatory Visit (HOSPITAL_BASED_OUTPATIENT_CLINIC_OR_DEPARTMENT_OTHER): Payer: Medicare Other | Attending: Neurosurgery

## 2015-07-03 ENCOUNTER — Ambulatory Visit (HOSPITAL_BASED_OUTPATIENT_CLINIC_OR_DEPARTMENT_OTHER)
Admission: RE | Admit: 2015-07-03 | Discharge: 2015-07-03 | Disposition: A | Discharge status: Home / Self Care | Payer: Medicare Other | Source: Ambulatory Visit | Attending: Neurosurgery | Admitting: Neurosurgery

## 2015-07-03 DIAGNOSIS — M5136 Other intervertebral disc degeneration, lumbar region: Secondary | ICD-10-CM | POA: Insufficient documentation

## 2015-07-03 DIAGNOSIS — Z981 Arthrodesis status: Secondary | ICD-10-CM | POA: Diagnosis not present

## 2015-07-03 DIAGNOSIS — M545 Low back pain: Secondary | ICD-10-CM | POA: Diagnosis present

## 2015-07-03 DIAGNOSIS — M47896 Other spondylosis, lumbar region: Secondary | ICD-10-CM | POA: Diagnosis not present

## 2015-07-03 DIAGNOSIS — M5137 Other intervertebral disc degeneration, lumbosacral region: Secondary | ICD-10-CM

## 2015-11-20 ENCOUNTER — Other Ambulatory Visit (HOSPITAL_BASED_OUTPATIENT_CLINIC_OR_DEPARTMENT_OTHER): Payer: Self-pay | Admitting: Neurosurgery

## 2015-11-20 DIAGNOSIS — M5137 Other intervertebral disc degeneration, lumbosacral region: Secondary | ICD-10-CM

## 2015-11-23 ENCOUNTER — Ambulatory Visit (HOSPITAL_BASED_OUTPATIENT_CLINIC_OR_DEPARTMENT_OTHER)
Admission: RE | Admit: 2015-11-23 | Discharge: 2015-11-23 | Disposition: A | Discharge status: Home / Self Care | Payer: Medicare Other | Source: Ambulatory Visit | Attending: Neurosurgery | Admitting: Neurosurgery

## 2015-11-23 DIAGNOSIS — I700 Atherosclerosis of aorta: Secondary | ICD-10-CM | POA: Insufficient documentation

## 2015-11-23 DIAGNOSIS — I708 Atherosclerosis of other arteries: Secondary | ICD-10-CM | POA: Insufficient documentation

## 2015-11-23 DIAGNOSIS — M5137 Other intervertebral disc degeneration, lumbosacral region: Secondary | ICD-10-CM

## 2015-11-23 DIAGNOSIS — R93421 Abnormal radiologic findings on diagnostic imaging of right kidney: Secondary | ICD-10-CM | POA: Insufficient documentation

## 2015-11-23 DIAGNOSIS — M4806 Spinal stenosis, lumbar region: Secondary | ICD-10-CM | POA: Insufficient documentation

## 2016-01-15 ENCOUNTER — Other Ambulatory Visit (HOSPITAL_BASED_OUTPATIENT_CLINIC_OR_DEPARTMENT_OTHER): Payer: Self-pay | Admitting: Neurosurgery

## 2016-01-15 ENCOUNTER — Ambulatory Visit (HOSPITAL_BASED_OUTPATIENT_CLINIC_OR_DEPARTMENT_OTHER)
Admission: RE | Admit: 2016-01-15 | Discharge: 2016-01-15 | Disposition: A | Discharge status: Home / Self Care | Payer: Medicare Other | Source: Ambulatory Visit | Attending: Neurosurgery | Admitting: Neurosurgery

## 2016-01-15 DIAGNOSIS — M25551 Pain in right hip: Secondary | ICD-10-CM | POA: Insufficient documentation

## 2016-01-21 ENCOUNTER — Other Ambulatory Visit: Payer: Self-pay | Admitting: Neurosurgery

## 2016-01-21 DIAGNOSIS — M5137 Other intervertebral disc degeneration, lumbosacral region: Secondary | ICD-10-CM

## 2016-01-28 ENCOUNTER — Ambulatory Visit
Admission: RE | Admit: 2016-01-28 | Discharge: 2016-01-28 | Disposition: A | Discharge status: Home / Self Care | Payer: Medicare Other | Source: Ambulatory Visit | Attending: Neurosurgery | Admitting: Neurosurgery

## 2016-01-28 VITALS — BP 113/51 | HR 57

## 2016-01-28 DIAGNOSIS — M48061 Spinal stenosis, lumbar region without neurogenic claudication: Secondary | ICD-10-CM

## 2016-01-28 DIAGNOSIS — M5137 Other intervertebral disc degeneration, lumbosacral region: Secondary | ICD-10-CM

## 2016-01-28 MED ORDER — DIAZEPAM 5 MG PO TABS
5.0000 mg | ORAL_TABLET | Freq: Once | ORAL | Status: AC
  Administered 2016-01-28: 5 mg via ORAL

## 2016-01-28 MED ORDER — IOPAMIDOL (ISOVUE-M 200) INJECTION 41%
15.0000 mL | Freq: Once | INTRAMUSCULAR | Status: AC
Start: 2016-01-28 — End: 2016-01-28
  Administered 2016-01-28: 15 mL via INTRATHECAL

## 2016-01-28 MED ORDER — MEPERIDINE HCL 100 MG/ML IJ SOLN
100.0000 mg | Freq: Once | INTRAMUSCULAR | Status: AC
  Administered 2016-01-28: 100 mg via INTRAMUSCULAR

## 2016-01-28 MED ORDER — ONDANSETRON HCL 4 MG/2ML IJ SOLN
4.0000 mg | Freq: Once | INTRAMUSCULAR | Status: AC
  Administered 2016-01-28: 4 mg via INTRAMUSCULAR

## 2016-01-28 NOTE — Discharge Instructions (Signed)
Myelogram Discharge Instructions  1. Go home and rest quietly for the next 24 hours.  It is important to lie flat for the next 24 hours.  Get up only to go to the restroom.  You may lie in the bed or on a couch on your back, your stomach, your left side or your right side.  You may have one pillow under your head.  You may have pillows between your knees while you are on your side or under your knees while you are on your back.  2. DO NOT drive today.  Recline the seat as far back as it will go, while still wearing your seat belt, on the way home.  3. You may get up to go to the bathroom as needed.  You may sit up for 10 minutes to eat.  You may resume your normal diet and medications unless otherwise indicated.  Drink lots of extra fluids today and tomorrow.  4. The incidence of headache, nausea, or vomiting is about 5% (one in 20 patients).  If you develop a headache, lie flat and drink plenty of fluids until the headache goes away.  Caffeinated beverages may be helpful.  If you develop severe nausea and vomiting or a headache that does not go away with flat bed rest, call [phone].  5. You may resume normal activities after your 24 hours of bed rest is over; however, do not exert yourself strongly or do any heavy lifting tomorrow. If when you get up you have a headache when standing, go back to bed and force fluids for another 24 hours.  6. Call your physician for a follow-up appointment.  The results of your myelogram will be sent directly to your physician by the following day.  7. If you have any questions or if complications develop after you arrive home, please call [phone].  Discharge instructions have been explained to the patient.  The patient, or the person responsible for the patient, fully understands these instructions.       May resume Trazodone on Jan 29, 2016, after 11:00 am.

## 2016-01-28 NOTE — Progress Notes (Signed)
Pt states she has been off Trazodone for the past 2 days.

## 2017-06-26 IMAGING — CT CT L SPINE W/ CM
3 of 8 series · 9 of 33 positions shown, 10 images · non-contrast
Comparison: Multiple prior exams including CT and radiographs.

CLINICAL DATA: Lumbosacral degenerative disc disease. Initial
encounter.
TECHNIQUE: Contiguous axial images were obtained through the Lumbar spine after
the intrathecal infusion of infusion. Coronal and sagittal
reconstructions were obtained of the axial image sets.

[Series 3: l spine soft · axial · 0.27mm/px · z∈[-178,-178]mm · 1 of 91 slices shown, 2 images]
[im 52/91  soft-tissue]
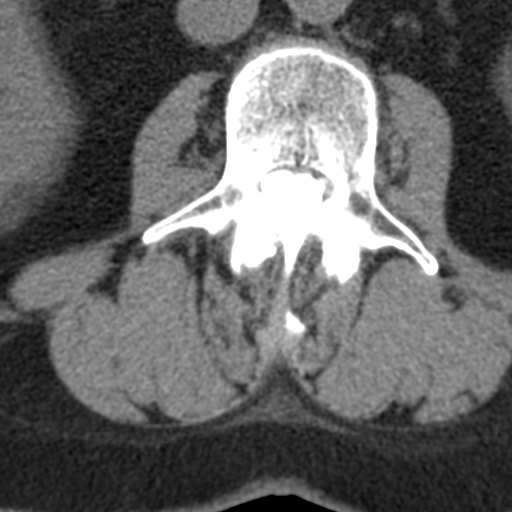
[im 52/91  bone]
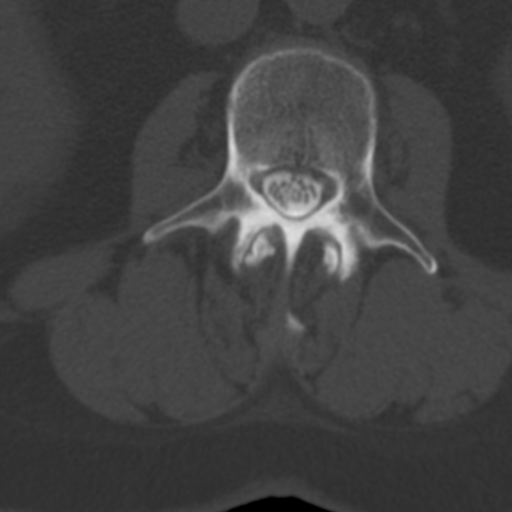

[Series 7: bone cor · coronal · 0.30mm/px · 3 of 36 slices shown]
[im 8/36  bone]
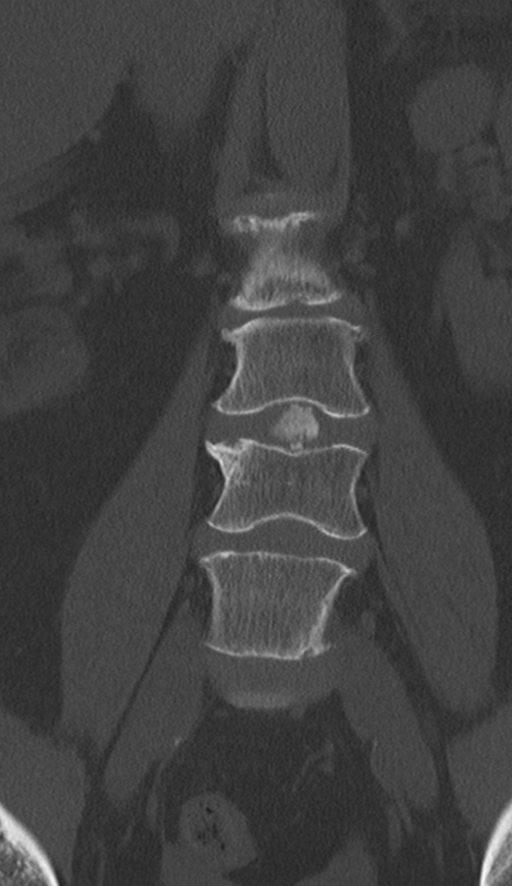
[im 15/36  bone]
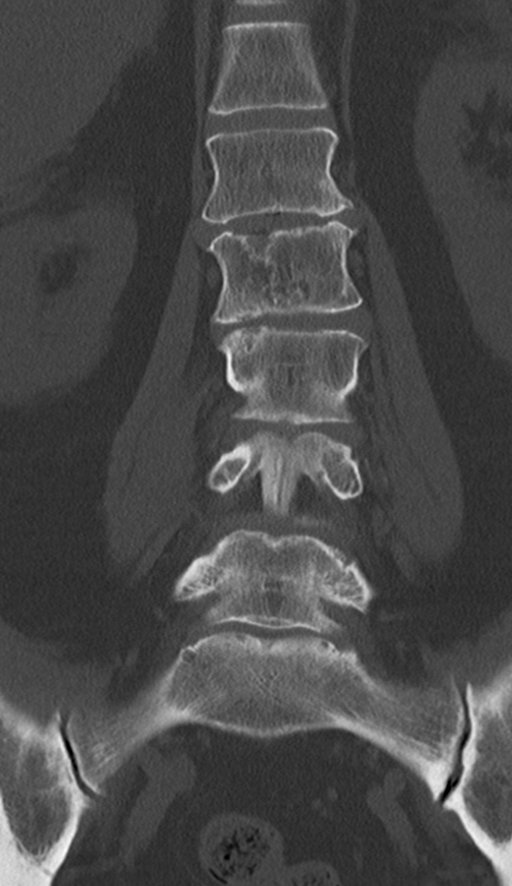
[im 22/36  bone]
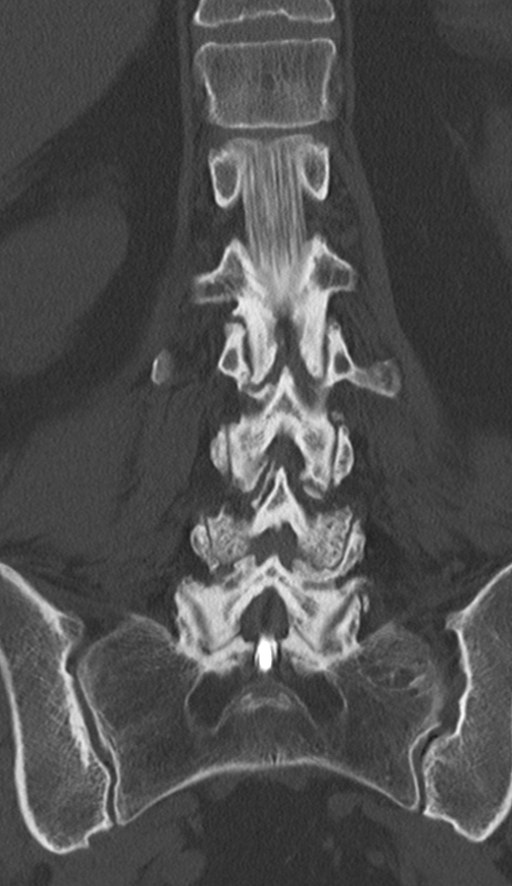

[Series 8: sag bone · sagittal · 0.28mm/px · 5 of 33 slices shown]
[im 11/33  bone]
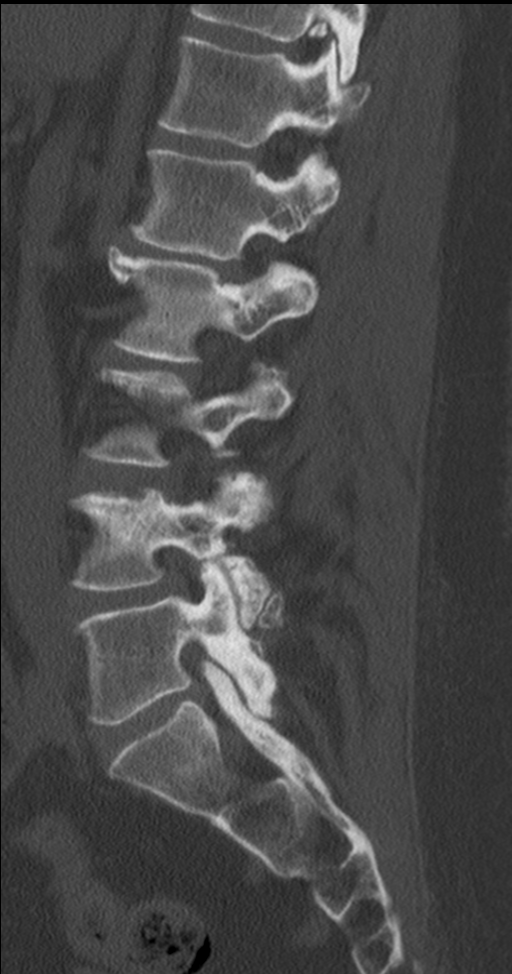
[im 14/33  bone]
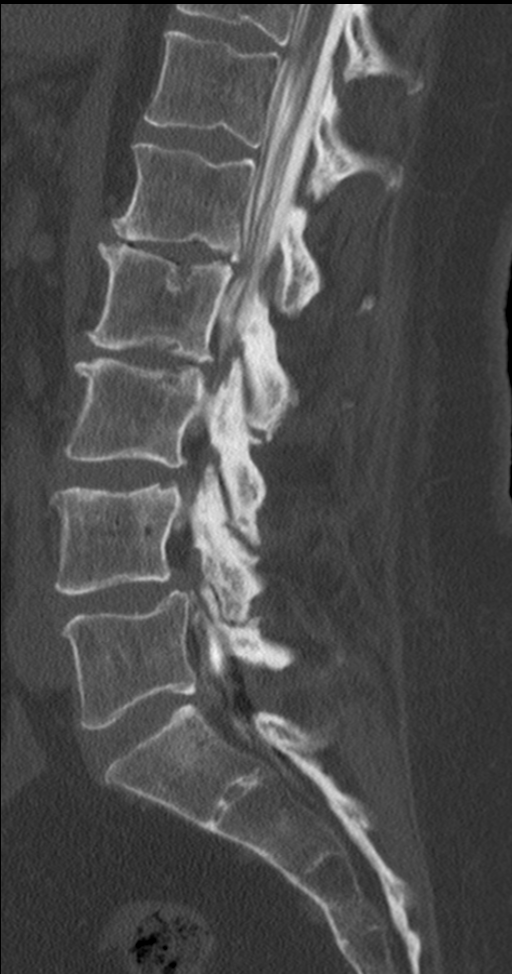
[im 17/33  bone]
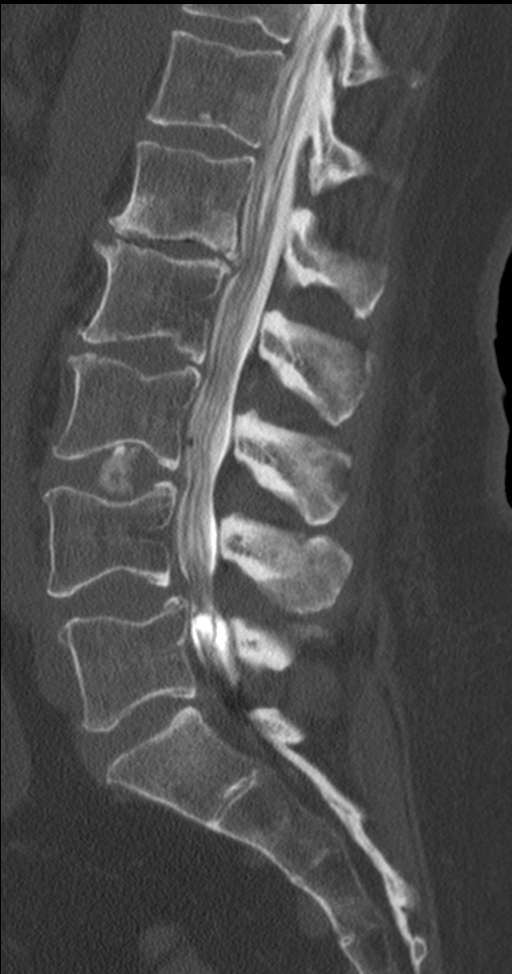
[im 19/33  bone]
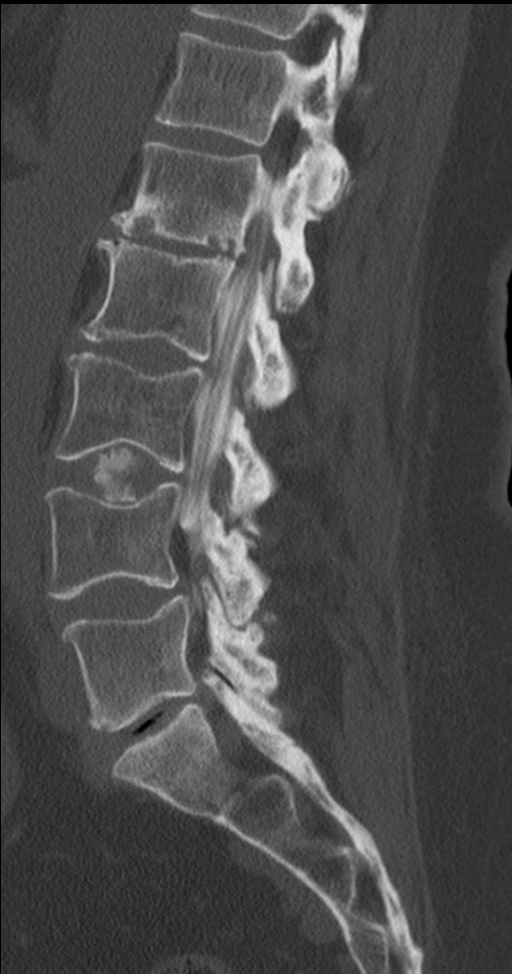
[im 22/33  bone]
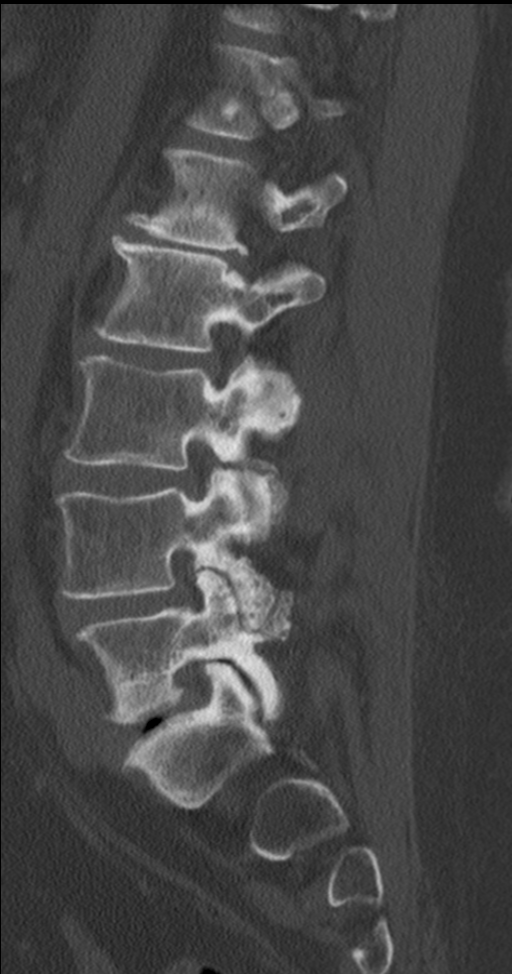

[9 of 33 positions shown; findings below may reference images not displayed]

EXAM:
LUMBAR MYELOGRAM

FLUOROSCOPY TIME:  04/02/2015.

PROCEDURE:
After thorough discussion of risks and benefits of the procedure
including bleeding, infection, injury to nerves, blood vessels,
adjacent structures as well as headache and CSF leak, written and
oral informed consent was obtained. Consent was obtained by Dr.
Md Nayem Mahboob. Time out form was completed.

Patient was positioned prone on the fluoroscopy table. Local
anesthesia was provided with 1% lidocaine without epinephrine after
prepped and draped in the usual sterile fashion. Puncture was
performed at L2-L3 using a 3 1/2 inch 22 gauge atraumatic (Vandy)
spinal needle via RIGHT paramedian approach. Using a single pass
through the dura, the needle was placed within the thecal sac, with
return of clear CSF. 15 mL of Qmnipaque-6DH was injected into the
thecal sac, with normal opacification of the nerve roots and cauda
equina consistent with free flow within the subarachnoid space.

I personally performed the lumbar puncture and administered the
intrathecal contrast. I also personally supervised acquisition of
the myelogram images.
FINDINGS: LUMBAR MYELOGRAM FINDINGS:

On the standing upright images, there is a mild levoconvex curve
with the apex at L3-L4. 4 mm of grade I anterolisthesis are present
at L4-L5. This does not change with flexion and extension.
Associated degenerative disc disease is present. Bilateral L4-L5
subarticular/lateral recess stenosis is present, greater on the
RIGHT when compared to the LEFT. There is also mild transverse
narrowing of the thecal sac L5-S1 suggesting facet arthrosis.

CT LUMBAR MYELOGRAM FINDINGS:

Segmentation: The numbering convention used for this exam termed
L5-S1 as the last intervertebral disc space.

Alignment: Levoconvex curve unchanged. Grade I anterolisthesis of L4
on L5 also appears similar.

Vertebrae: No aggressive osseous lesions. Schmorl's nodes.
Degenerative endplate changes worst at L1-L2.

Conus medullaris: Normal at T12-L1.

Paraspinal tissues: Atherosclerosis.

Disc levels:

T11-T12:  Negative.

T12-L1:  Negative.

L1-L2: Severe disc degeneration with collapse of the disc. Vacuum
disc and Schmorl's nodes. Shallow endplate osteophytes project into
the central canal however the central canal and foramina appear
adequately patent.

L2-L3: Degenerative disc disease with circumferential disc bulging.
Neural foramina are patent. Mild to moderate bilateral facet
arthrosis, greater on the RIGHT when compared to the LEFT. The
neural foramina are adequately patent.

L3-L4: Mild multifactorial central stenosis. Shallow broad-based
disc bulging and posterior ligamentum flavum redundancy. Moderate
RIGHT and mild LEFT facet arthrosis. Neural foramina appear patent.
Ossification of the nucleus pulposis is present.

L4-L5: Severe multifactorial central stenosis. Scant cerebrospinal
fluid is preserved in the central canal. There is bilateral
subarticular stenosis. Severe bilateral facet arthrosis.
Anterolisthesis leads to uncoverage of the disc and broad-based
posterior bulging. Moderate bilateral foraminal stenosis associated
with degenerative disc and facet disease.

L5-S1: Moderate bilateral facet arthrosis crash that severe
bilateral facet arthrosis. Transverse narrowing of the thecal sac
with LEFT-greater-than-RIGHT subarticular encroachment. There is no
filling of the descending LEFT S1 nerve root sleeve due to
compression from anterior facet osteophytes on the LEFT. There is
some filling of the descending RIGHT S1 nerve root sleeve. RIGHT
neural foramen appears adequately patent. Moderate LEFT foraminal
stenosis secondary to bulging disc, endplate osteophytes and facet
arthrosis. Central canal appears adequately patent.
IMPRESSION: LUMBAR MYELOGRAM IMPRESSION:

1. Technically successful lumbar puncture with atraumatic spinal
needle.
2. 4 mm of anterolisthesis of L4 on L5 without instability on
flexion and extension images.
3. Mild levoconvex scoliosis of the lumbar spine.

CT LUMBAR MYELOGRAM IMPRESSION:

1. L4-L5 predominant lumbar degenerative disc and facet disease.
Severe L4-L5 central stenosis with bilateral subarticular stenosis
and bilateral foraminal stenosis.
2. L5-S1 severe bilateral facet arthrosis with LEFT subarticular
stenosis potentially affecting the descending LEFT S1 nerve.
3. Degenerative disc and facet disease at other levels with less
pronounced stenosis.

## 2017-06-26 IMAGING — RF DG MYELOGRAPHY LUMBAR INJ LUMBOSACRAL
12 of 14 series · 12 of 14 positions shown · non-contrast
Comparison: Multiple prior exams including CT and radiographs.

CLINICAL DATA: Lumbosacral degenerative disc disease. Initial
encounter.
TECHNIQUE: Contiguous axial images were obtained through the Lumbar spine after
the intrathecal infusion of infusion. Coronal and sagittal
reconstructions were obtained of the axial image sets.

[Series 3: (hospital) · 1 of 1 slices shown]
[im 1/1]
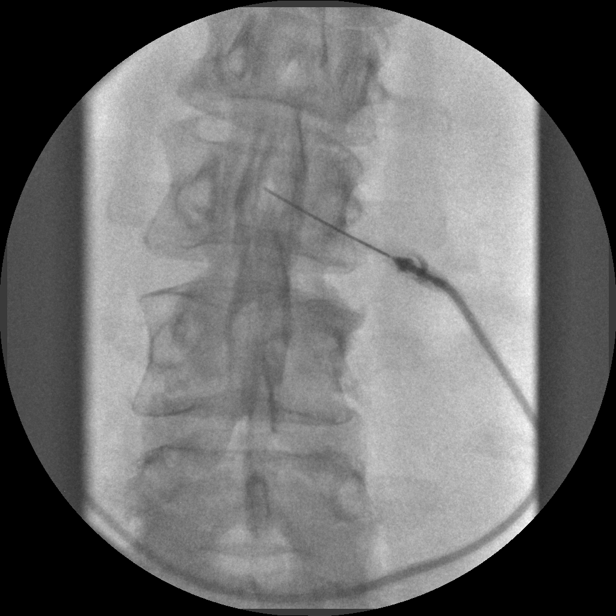

[Series 4: myelogram  white · 1 of 1 slices shown (1 of 11)]
[im 1/1]
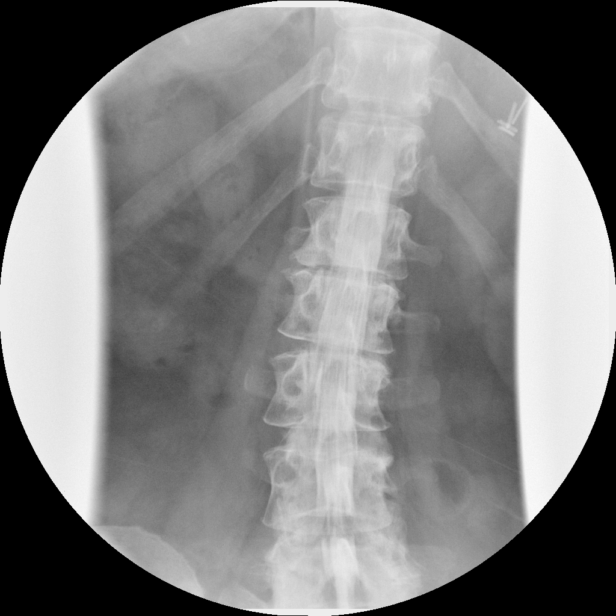

[Series 5: myelogram  white · 1 of 1 slices shown (2 of 11)]
[im 1/1]
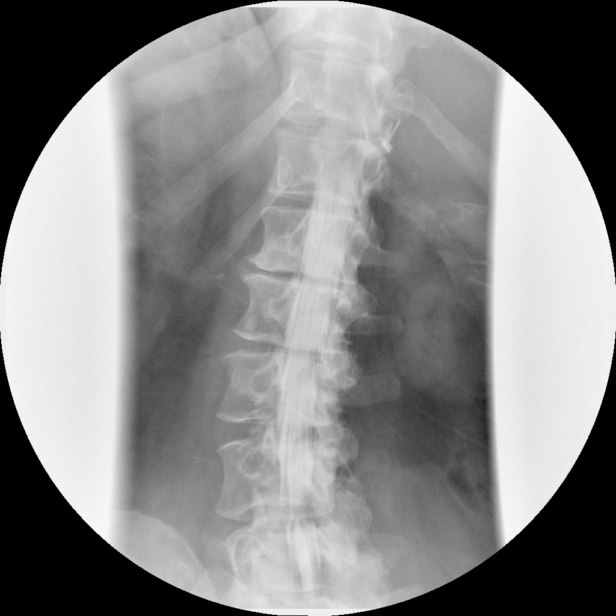

[Series 7: myelogram  white · 1 of 1 slices shown (3 of 11)]
[im 1/1]
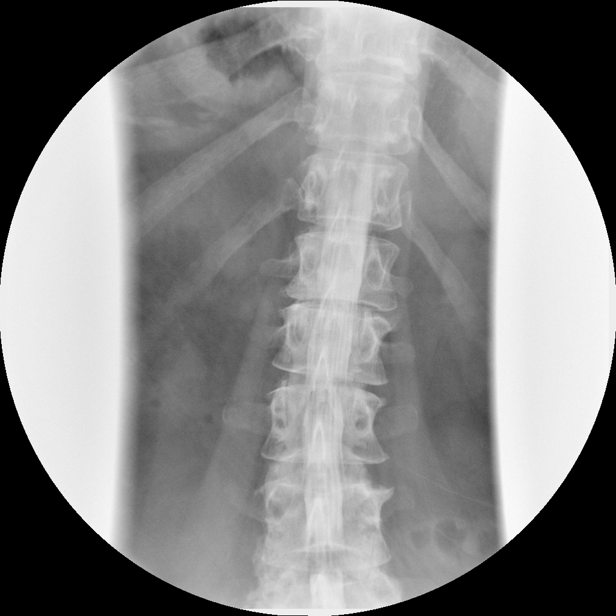

[Series 8: myelogram  white · 1 of 1 slices shown (4 of 11)]
[im 1/1]
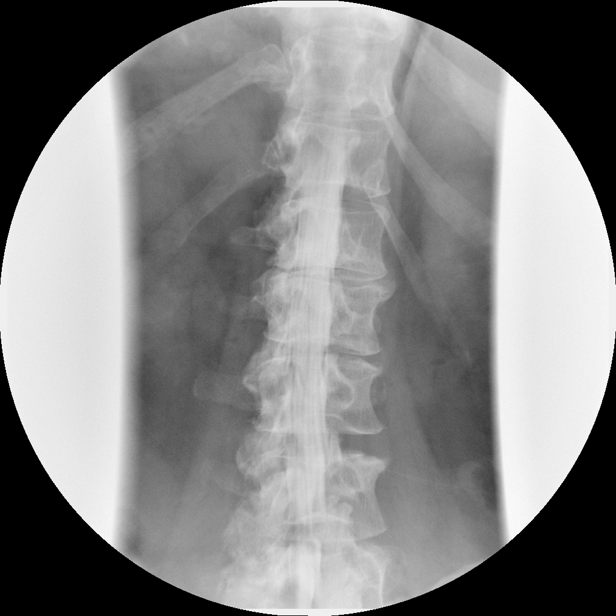

[Series 9: myelogram  white · 1 of 1 slices shown (5 of 11)]
[im 1/1]
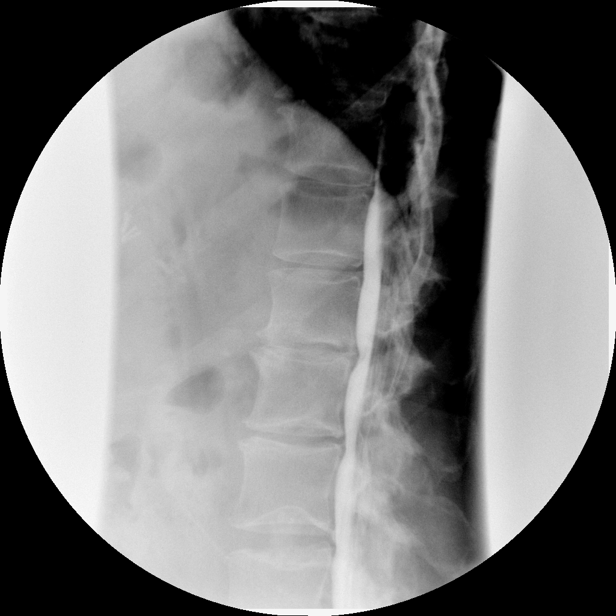

[Series 10: myelogram  white · 1 of 1 slices shown (6 of 11)]
[im 1/1]
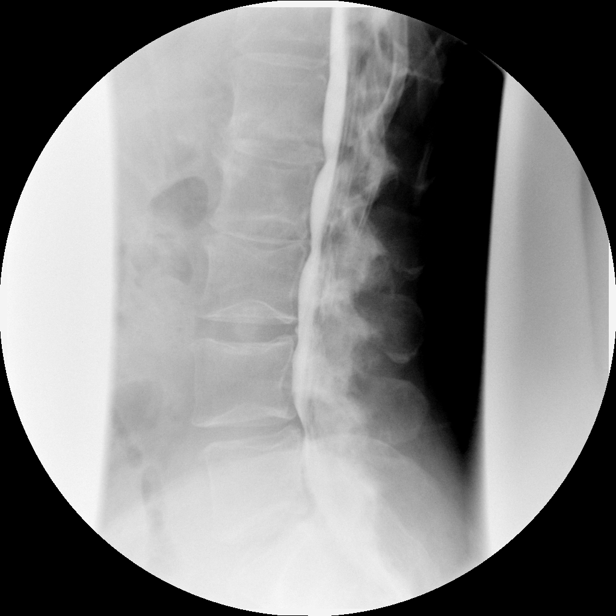

[Series 11: myelogram  white · 1 of 1 slices shown (7 of 11)]
[im 1/1]
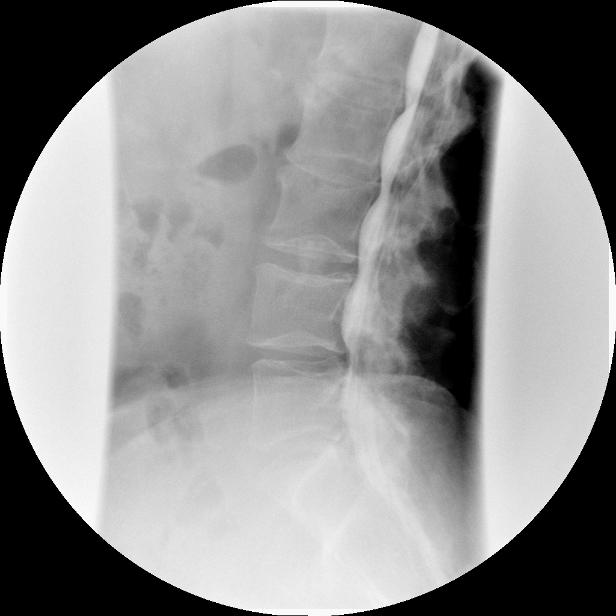

[Series 12: myelogram  white · 1 of 1 slices shown (8 of 11)]
[im 1/1]
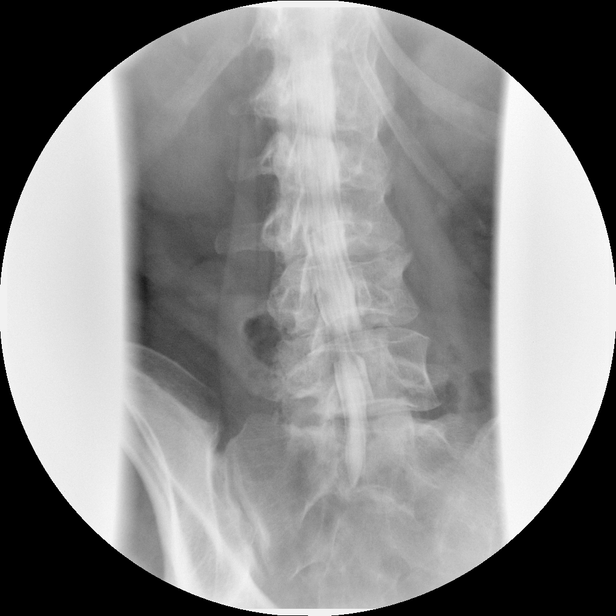

[Series 14: myelogram  white · 1 of 1 slices shown (9 of 11)]
[im 1/1]
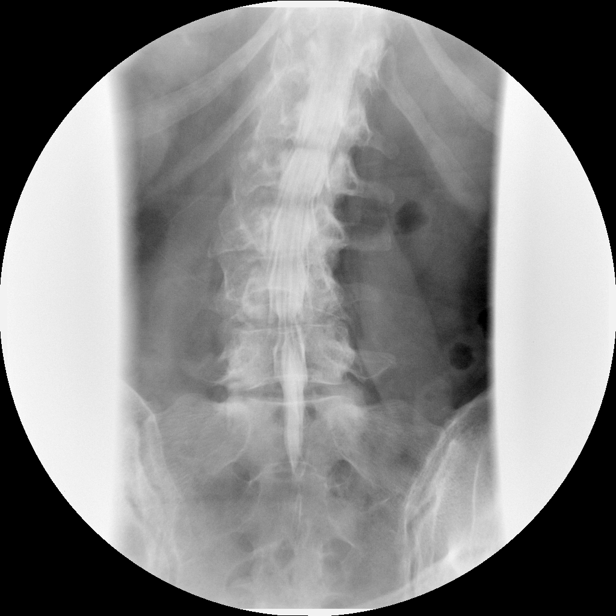

[Series 15: myelogram  white · 1 of 1 slices shown (10 of 11)]
[im 1/1]
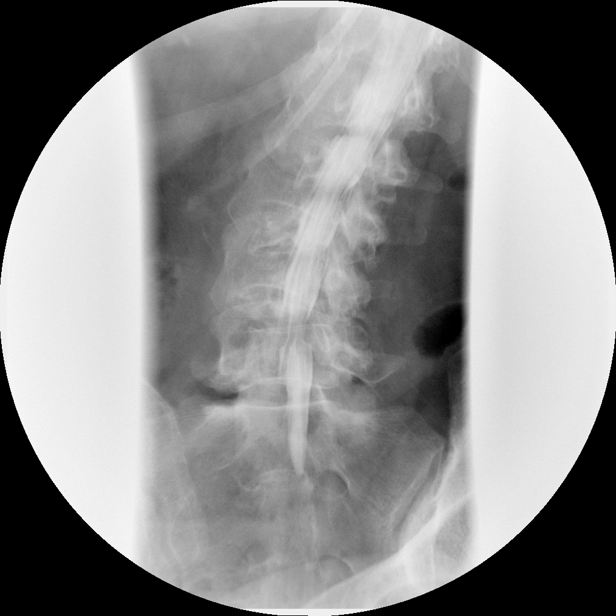

[Series 16: myelogram  white · 1 of 1 slices shown (11 of 11)]
[im 1/1]
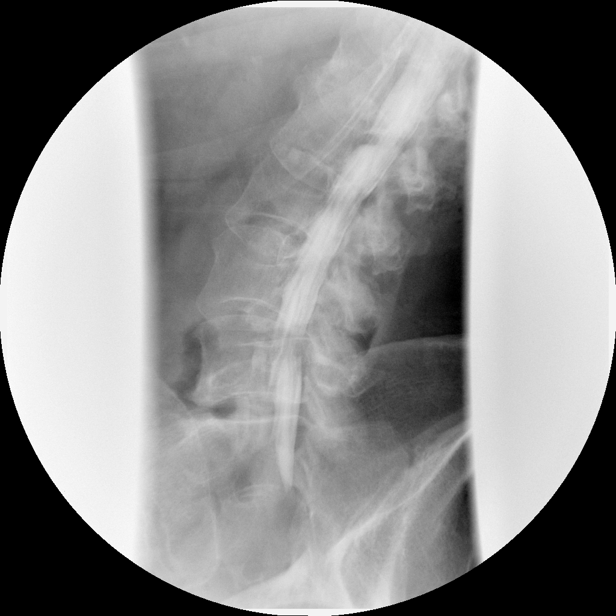

[12 of 14 positions shown; findings below may reference images not displayed]

EXAM:
LUMBAR MYELOGRAM

FLUOROSCOPY TIME:  04/02/2015.

PROCEDURE:
After thorough discussion of risks and benefits of the procedure
including bleeding, infection, injury to nerves, blood vessels,
adjacent structures as well as headache and CSF leak, written and
oral informed consent was obtained. Consent was obtained by Dr.
Md Nayem Mahboob. Time out form was completed.

Patient was positioned prone on the fluoroscopy table. Local
anesthesia was provided with 1% lidocaine without epinephrine after
prepped and draped in the usual sterile fashion. Puncture was
performed at L2-L3 using a 3 1/2 inch 22 gauge atraumatic (Vandy)
spinal needle via RIGHT paramedian approach. Using a single pass
through the dura, the needle was placed within the thecal sac, with
return of clear CSF. 15 mL of Qmnipaque-6DH was injected into the
thecal sac, with normal opacification of the nerve roots and cauda
equina consistent with free flow within the subarachnoid space.

I personally performed the lumbar puncture and administered the
intrathecal contrast. I also personally supervised acquisition of
the myelogram images.
FINDINGS: LUMBAR MYELOGRAM FINDINGS:

On the standing upright images, there is a mild levoconvex curve
with the apex at L3-L4. 4 mm of grade I anterolisthesis are present
at L4-L5. This does not change with flexion and extension.
Associated degenerative disc disease is present. Bilateral L4-L5
subarticular/lateral recess stenosis is present, greater on the
RIGHT when compared to the LEFT. There is also mild transverse
narrowing of the thecal sac L5-S1 suggesting facet arthrosis.

CT LUMBAR MYELOGRAM FINDINGS:

Segmentation: The numbering convention used for this exam termed
L5-S1 as the last intervertebral disc space.

Alignment: Levoconvex curve unchanged. Grade I anterolisthesis of L4
on L5 also appears similar.

Vertebrae: No aggressive osseous lesions. Schmorl's nodes.
Degenerative endplate changes worst at L1-L2.

Conus medullaris: Normal at T12-L1.

Paraspinal tissues: Atherosclerosis.

Disc levels:

T11-T12:  Negative.

T12-L1:  Negative.

L1-L2: Severe disc degeneration with collapse of the disc. Vacuum
disc and Schmorl's nodes. Shallow endplate osteophytes project into
the central canal however the central canal and foramina appear
adequately patent.

L2-L3: Degenerative disc disease with circumferential disc bulging.
Neural foramina are patent. Mild to moderate bilateral facet
arthrosis, greater on the RIGHT when compared to the LEFT. The
neural foramina are adequately patent.

L3-L4: Mild multifactorial central stenosis. Shallow broad-based
disc bulging and posterior ligamentum flavum redundancy. Moderate
RIGHT and mild LEFT facet arthrosis. Neural foramina appear patent.
Ossification of the nucleus pulposis is present.

L4-L5: Severe multifactorial central stenosis. Scant cerebrospinal
fluid is preserved in the central canal. There is bilateral
subarticular stenosis. Severe bilateral facet arthrosis.
Anterolisthesis leads to uncoverage of the disc and broad-based
posterior bulging. Moderate bilateral foraminal stenosis associated
with degenerative disc and facet disease.

L5-S1: Moderate bilateral facet arthrosis crash that severe
bilateral facet arthrosis. Transverse narrowing of the thecal sac
with LEFT-greater-than-RIGHT subarticular encroachment. There is no
filling of the descending LEFT S1 nerve root sleeve due to
compression from anterior facet osteophytes on the LEFT. There is
some filling of the descending RIGHT S1 nerve root sleeve. RIGHT
neural foramen appears adequately patent. Moderate LEFT foraminal
stenosis secondary to bulging disc, endplate osteophytes and facet
arthrosis. Central canal appears adequately patent.
IMPRESSION: LUMBAR MYELOGRAM IMPRESSION:

1. Technically successful lumbar puncture with atraumatic spinal
needle.
2. 4 mm of anterolisthesis of L4 on L5 without instability on
flexion and extension images.
3. Mild levoconvex scoliosis of the lumbar spine.

CT LUMBAR MYELOGRAM IMPRESSION:

1. L4-L5 predominant lumbar degenerative disc and facet disease.
Severe L4-L5 central stenosis with bilateral subarticular stenosis
and bilateral foraminal stenosis.
2. L5-S1 severe bilateral facet arthrosis with LEFT subarticular
stenosis potentially affecting the descending LEFT S1 nerve.
3. Degenerative disc and facet disease at other levels with less
pronounced stenosis.

## 2017-10-06 ENCOUNTER — Other Ambulatory Visit: Payer: Self-pay | Admitting: Neurosurgery

## 2017-10-18 ENCOUNTER — Inpatient Hospital Stay (HOSPITAL_COMMUNITY): Admission: RE | Admit: 2017-10-18 | Payer: Medicare Other | Source: Ambulatory Visit

## 2017-10-23 ENCOUNTER — Inpatient Hospital Stay: Admit: 2017-10-23 | Payer: Medicare Other | Admitting: Neurosurgery

## 2017-10-23 SURGERY — LAMINECTOMY WITH POSTERIOR LATERAL ARTHRODESIS LEVEL 1
Anesthesia: General | Site: Back

## 2017-11-20 ENCOUNTER — Other Ambulatory Visit: Payer: Self-pay | Admitting: Neurosurgery

## 2017-12-01 NOTE — Pre-Procedure Instructions (Signed)
Joyce Kelly  12/01/2017      [facility] - [place], [place] - [address] [place] [id number] Phone: [phone] Fax: [phone]  [facility] [phone] - [place], [place] - [address] AT [facility] [address] [place] [id number] Phone: [phone] Fax: [phone]    Your procedure is scheduled on April 5  [facility] at 1050 A.M.  Call this number if you have problems the morning of surgery:  [phone]   Remember:  Do not eat food or drink liquids after midnight.  Continue all medications as directed by your physician except follow these medication instructions before surgery below   Take these medicines the morning of surgery with A SIP OF WATER  cyclobenzaprine (FLEXERIL)  omeprazole (PRILOSEC)  7 days prior to surgery STOP taking any Aspirin(unless otherwise instructed by your surgeon), Aleve, Naproxen, Ibuprofen, Motrin, Advil, Goody's, BC's, all herbal medications, fish oil, and all vitamins    Do not wear jewelry, make-up or nail polish.  Do not wear lotions, powders, or perfumes, or deodorant.  Do not shave 48 hours prior to surgery.    Do not bring valuables to the hospital.  [facility] is not responsible for any belongings or valuables.  Contacts, dentures or bridgework may not be worn into surgery.  Leave your suitcase in the car.  After surgery it may be brought to your room.  For patients admitted to the hospital, discharge time will be determined by your treatment team.  Patients discharged the day of surgery will not be allowed to drive home.    Special instructions:   [place]- Preparing For Surgery  Before surgery, you can play an important role. Because skin is not sterile, your skin needs to be as free of germs as possible. You can reduce the number of germs on your skin by washing with CHG (chlorahexidine gluconate)  Soap before surgery.  CHG is an antiseptic cleaner which kills germs and bonds with the skin to continue killing germs even after washing.  Please do not use if you have an allergy to CHG or antibacterial soaps. If your skin becomes reddened/irritated stop using the CHG.  Do not shave (including legs and underarms) for at least 48 hours prior to first CHG shower. It is OK to shave your face.  Please follow these instructions carefully.   1. Shower the NIGHT BEFORE SURGERY and the MORNING OF SURGERY with CHG.   2. If you chose to wash your hair, wash your hair first as usual with your normal shampoo.  3. After you shampoo, rinse your hair and body thoroughly to remove the shampoo.  4. Use CHG as you would any other liquid soap. You can apply CHG directly to the skin and wash gently with a scrungie or a clean washcloth.   5. Apply the CHG Soap to your body ONLY FROM THE NECK DOWN.  Do not use on open wounds or open sores. Avoid contact with your eyes, ears, mouth and genitals (private parts). Wash Face and genitals (private parts)  with your normal soap.  6. Wash thoroughly, paying special attention to the area where your surgery will be performed.  7. Thoroughly rinse your body with warm water from the neck down.  8. DO NOT shower/wash with your normal soap after using and rinsing off the CHG Soap.  9. Pat yourself dry with a CLEAN TOWEL.  10. Wear CLEAN PAJAMAS to bed the night before surgery, wear comfortable clothes the morning of surgery  11. Place CLEAN SHEETS on your bed the night of your first shower and DO NOT SLEEP WITH PETS.    Day of Surgery: Do not apply any deodorants/lotions. Please wear clean clothes to the hospital/surgery center.      Please read over the following fact sheets that you were given.

## 2017-12-04 ENCOUNTER — Encounter (HOSPITAL_COMMUNITY)
Admission: RE | Admit: 2017-12-04 | Discharge: 2017-12-04 | Disposition: A | Discharge status: Home / Self Care | Payer: Medicare Other | Source: Ambulatory Visit | Attending: Neurosurgery | Admitting: Neurosurgery

## 2017-12-04 ENCOUNTER — Encounter (HOSPITAL_COMMUNITY): Payer: Self-pay

## 2017-12-04 ENCOUNTER — Other Ambulatory Visit: Payer: Self-pay

## 2017-12-04 DIAGNOSIS — Z01810 Encounter for preprocedural cardiovascular examination: Secondary | ICD-10-CM | POA: Diagnosis not present

## 2017-12-04 DIAGNOSIS — Z01812 Encounter for preprocedural laboratory examination: Secondary | ICD-10-CM | POA: Diagnosis not present

## 2017-12-04 HISTORY — DX: Fibromyalgia: M79.7

## 2017-12-04 LAB — CBC
HEMATOCRIT: 39.4 % (ref 36.0–46.0)
Hemoglobin: 12.5 g/dL (ref 12.0–15.0)
MCH: 29 pg (ref 26.0–34.0)
MCHC: 31.7 g/dL (ref 30.0–36.0)
MCV: 91.4 fL (ref 78.0–100.0)
Platelets: 229 10*3/uL (ref 150–400)
RBC: 4.31 MIL/uL (ref 3.87–5.11)
RDW: 12.8 % (ref 11.5–15.5)
WBC: 5.8 10*3/uL (ref 4.0–10.5)

## 2017-12-04 LAB — BASIC METABOLIC PANEL
ANION GAP: 8 (ref 5–15)
BUN: 21 mg/dL — AB (ref 6–20)
CHLORIDE: 103 mmol/L (ref 101–111)
CO2: 26 mmol/L (ref 22–32)
Calcium: 9.3 mg/dL (ref 8.9–10.3)
Creatinine, Ser: 0.93 mg/dL (ref 0.44–1.00)
GFR calc Af Amer: >60 mL/min (ref >60)
GFR calc non Af Amer: 60 mL/min — ABNORMAL LOW (ref >60)
GLUCOSE: 107 mg/dL — AB (ref 65–99)
POTASSIUM: 4.4 mmol/L (ref 3.5–5.1)
Sodium: 137 mmol/L (ref 135–145)

## 2017-12-04 LAB — TYPE AND SCREEN
ABO/RH(D): A POS
ANTIBODY SCREEN: NEGATIVE

## 2017-12-04 LAB — SURGICAL PCR SCREEN
MRSA, PCR: NEGATIVE
Staphylococcus aureus: NEGATIVE

## 2017-12-04 NOTE — Progress Notes (Signed)
PCP  Albertina SenegalNelson Pollock  Denies any cardiac problems never seen a cardiologist,denies any cardiac testing.

## 2017-12-08 ENCOUNTER — Inpatient Hospital Stay (HOSPITAL_COMMUNITY): Payer: Medicare Other

## 2017-12-08 ENCOUNTER — Inpatient Hospital Stay (HOSPITAL_COMMUNITY): Payer: Medicare Other | Admitting: Certified Registered Nurse Anesthetist

## 2017-12-08 ENCOUNTER — Inpatient Hospital Stay (HOSPITAL_COMMUNITY)
Admission: RE | Admit: 2017-12-08 | Discharge: 2017-12-11 | DRG: 460 | Disposition: A | Discharge status: Home Health | Payer: Medicare Other | Source: Ambulatory Visit | Attending: Neurosurgery | Admitting: Neurosurgery

## 2017-12-08 ENCOUNTER — Encounter (HOSPITAL_COMMUNITY)
Admission: RE | Disposition: A | Discharge status: Home Health | Payer: Self-pay | Source: Ambulatory Visit | Attending: Neurosurgery

## 2017-12-08 ENCOUNTER — Encounter (HOSPITAL_COMMUNITY): Payer: Self-pay | Admitting: Certified Registered Nurse Anesthetist

## 2017-12-08 ENCOUNTER — Other Ambulatory Visit: Payer: Self-pay

## 2017-12-08 DIAGNOSIS — M960 Pseudarthrosis after fusion or arthrodesis: Principal | ICD-10-CM | POA: Diagnosis present

## 2017-12-08 DIAGNOSIS — Z9049 Acquired absence of other specified parts of digestive tract: Secondary | ICD-10-CM | POA: Diagnosis not present

## 2017-12-08 DIAGNOSIS — G629 Polyneuropathy, unspecified: Secondary | ICD-10-CM | POA: Diagnosis present

## 2017-12-08 DIAGNOSIS — Z96653 Presence of artificial knee joint, bilateral: Secondary | ICD-10-CM | POA: Diagnosis present

## 2017-12-08 DIAGNOSIS — G2581 Restless legs syndrome: Secondary | ICD-10-CM | POA: Diagnosis present

## 2017-12-08 DIAGNOSIS — Z9851 Tubal ligation status: Secondary | ICD-10-CM

## 2017-12-08 DIAGNOSIS — M797 Fibromyalgia: Secondary | ICD-10-CM | POA: Diagnosis present

## 2017-12-08 DIAGNOSIS — M419 Scoliosis, unspecified: Secondary | ICD-10-CM | POA: Diagnosis present

## 2017-12-08 DIAGNOSIS — Y793 Surgical instruments, materials and orthopedic devices (including sutures) associated with adverse incidents: Secondary | ICD-10-CM | POA: Diagnosis present

## 2017-12-08 DIAGNOSIS — Z79899 Other long term (current) drug therapy: Secondary | ICD-10-CM | POA: Diagnosis not present

## 2017-12-08 DIAGNOSIS — T84216A Breakdown (mechanical) of internal fixation device of vertebrae, initial encounter: Secondary | ICD-10-CM | POA: Diagnosis present

## 2017-12-08 DIAGNOSIS — Z90710 Acquired absence of both cervix and uterus: Secondary | ICD-10-CM | POA: Diagnosis not present

## 2017-12-08 DIAGNOSIS — M1990 Unspecified osteoarthritis, unspecified site: Secondary | ICD-10-CM | POA: Diagnosis present

## 2017-12-08 DIAGNOSIS — Y838 Other surgical procedures as the cause of abnormal reaction of the patient, or of later complication, without mention of misadventure at the time of the procedure: Secondary | ICD-10-CM | POA: Diagnosis present

## 2017-12-08 DIAGNOSIS — Z87891 Personal history of nicotine dependence: Secondary | ICD-10-CM | POA: Diagnosis not present

## 2017-12-08 DIAGNOSIS — Z881 Allergy status to other antibiotic agents status: Secondary | ICD-10-CM | POA: Diagnosis not present

## 2017-12-08 DIAGNOSIS — Z885 Allergy status to narcotic agent status: Secondary | ICD-10-CM

## 2017-12-08 DIAGNOSIS — M48061 Spinal stenosis, lumbar region without neurogenic claudication: Secondary | ICD-10-CM

## 2017-12-08 DIAGNOSIS — Z8249 Family history of ischemic heart disease and other diseases of the circulatory system: Secondary | ICD-10-CM | POA: Diagnosis not present

## 2017-12-08 DIAGNOSIS — Z888 Allergy status to other drugs, medicaments and biological substances status: Secondary | ICD-10-CM | POA: Diagnosis not present

## 2017-12-08 DIAGNOSIS — I10 Essential (primary) hypertension: Secondary | ICD-10-CM | POA: Diagnosis present

## 2017-12-08 DIAGNOSIS — T84038A Mechanical loosening of other internal prosthetic joint, initial encounter: Secondary | ICD-10-CM | POA: Diagnosis present

## 2017-12-08 DIAGNOSIS — Z880 Allergy status to penicillin: Secondary | ICD-10-CM

## 2017-12-08 DIAGNOSIS — K219 Gastro-esophageal reflux disease without esophagitis: Secondary | ICD-10-CM | POA: Diagnosis present

## 2017-12-08 DIAGNOSIS — M549 Dorsalgia, unspecified: Secondary | ICD-10-CM | POA: Diagnosis present

## 2017-12-08 DIAGNOSIS — S32009K Unspecified fracture of unspecified lumbar vertebra, subsequent encounter for fracture with nonunion: Secondary | ICD-10-CM

## 2017-12-08 HISTORY — PX: APPLICATION OF INTRAOPERATIVE CT SCAN: SHX6668

## 2017-12-08 LAB — CBC WITH DIFFERENTIAL/PLATELET
BASOS PCT: 0 %
Basophils Absolute: 0 10*3/uL (ref 0.0–0.1)
EOS ABS: 0 10*3/uL (ref 0.0–0.7)
Eosinophils Relative: 0 %
HCT: 34 % — ABNORMAL LOW (ref 36.0–46.0)
Hemoglobin: 10.8 g/dL — ABNORMAL LOW (ref 12.0–15.0)
Lymphocytes Relative: 4 %
Lymphs Abs: 0.6 10*3/uL — ABNORMAL LOW (ref 0.7–4.0)
MCH: 29.2 pg (ref 26.0–34.0)
MCHC: 31.8 g/dL (ref 30.0–36.0)
MCV: 91.9 fL (ref 78.0–100.0)
MONOS PCT: 2 %
Monocytes Absolute: 0.3 10*3/uL (ref 0.1–1.0)
Neutro Abs: 13.9 10*3/uL — ABNORMAL HIGH (ref 1.7–7.7)
Neutrophils Relative %: 94 %
Platelets: 218 10*3/uL (ref 150–400)
RBC: 3.7 MIL/uL — ABNORMAL LOW (ref 3.87–5.11)
RDW: 12.7 % (ref 11.5–15.5)
WBC: 14.8 10*3/uL — ABNORMAL HIGH (ref 4.0–10.5)

## 2017-12-08 LAB — BASIC METABOLIC PANEL
Anion gap: 12 (ref 5–15)
BUN: 13 mg/dL (ref 6–20)
CALCIUM: 8.1 mg/dL — AB (ref 8.9–10.3)
CO2: 19 mmol/L — AB (ref 22–32)
CREATININE: 1 mg/dL (ref 0.44–1.00)
Chloride: 104 mmol/L (ref 101–111)
GFR calc Af Amer: >60 mL/min (ref >60)
GFR calc non Af Amer: 55 mL/min — ABNORMAL LOW (ref >60)
Glucose, Bld: 226 mg/dL — ABNORMAL HIGH (ref 65–99)
Potassium: 4.1 mmol/L (ref 3.5–5.1)
Sodium: 135 mmol/L (ref 135–145)

## 2017-12-08 SURGERY — POSTERIOR LUMBAR FUSION 3 LEVEL
Anesthesia: General | Site: Spine Lumbar

## 2017-12-08 MED ORDER — PROPOFOL 10 MG/ML IV BOLUS
INTRAVENOUS | Status: DC | PRN
  Administered 2017-12-08: 140 mg via INTRAVENOUS

## 2017-12-08 MED ORDER — HEMOSTATIC AGENTS (NO CHARGE) OPTIME
TOPICAL | Status: DC | PRN
  Administered 2017-12-08: 1 via TOPICAL

## 2017-12-08 MED ORDER — ONDANSETRON HCL 4 MG/2ML IJ SOLN
INTRAMUSCULAR | Status: AC
  Filled 2017-12-08: qty 2

## 2017-12-08 MED ORDER — OXYCODONE HCL 5 MG PO TABS
10.0000 mg | ORAL_TABLET | ORAL | Status: DC | PRN
  Administered 2017-12-08 – 2017-12-11 (×7): 10 mg via ORAL
  Filled 2017-12-08 (×6): qty 2

## 2017-12-08 MED ORDER — LIDOCAINE-EPINEPHRINE 1 %-1:100000 IJ SOLN
INTRAMUSCULAR | Status: DC | PRN
  Administered 2017-12-08: 20 mL

## 2017-12-08 MED ORDER — POTASSIUM CHLORIDE CRYS ER 20 MEQ PO TBCR
20.0000 meq | EXTENDED_RELEASE_TABLET | Freq: Every day | ORAL | Status: DC
  Administered 2017-12-08 – 2017-12-11 (×4): 20 meq via ORAL
  Filled 2017-12-08 (×4): qty 1

## 2017-12-08 MED ORDER — DEXAMETHASONE SODIUM PHOSPHATE 10 MG/ML IJ SOLN
10.0000 mg | INTRAMUSCULAR | Status: AC
  Administered 2017-12-08: 10 mg via INTRAVENOUS
  Filled 2017-12-08: qty 1

## 2017-12-08 MED ORDER — TRAZODONE HCL 150 MG PO TABS
300.0000 mg | ORAL_TABLET | Freq: Every day | ORAL | Status: DC
  Administered 2017-12-08 – 2017-12-10 (×3): 300 mg via ORAL
  Filled 2017-12-08 (×4): qty 2

## 2017-12-08 MED ORDER — PROPOFOL 1000 MG/100ML IV EMUL
INTRAVENOUS | Status: AC
  Filled 2017-12-08: qty 100

## 2017-12-08 MED ORDER — ROCURONIUM BROMIDE 10 MG/ML (PF) SYRINGE
PREFILLED_SYRINGE | INTRAVENOUS | Status: DC | PRN
  Administered 2017-12-08 (×2): 20 mg via INTRAVENOUS
  Administered 2017-12-08: 40 mg via INTRAVENOUS
  Administered 2017-12-08 (×3): 20 mg via INTRAVENOUS
  Administered 2017-12-08: 60 mg via INTRAVENOUS

## 2017-12-08 MED ORDER — DEXAMETHASONE SODIUM PHOSPHATE 10 MG/ML IJ SOLN: INTRAMUSCULAR | Status: DC | PRN

## 2017-12-08 MED ORDER — CEFAZOLIN SODIUM-DEXTROSE 2-4 GM/100ML-% IV SOLN
2.0000 g | Freq: Three times a day (TID) | INTRAVENOUS | Status: AC
  Administered 2017-12-08 – 2017-12-10 (×6): 2 g via INTRAVENOUS
  Filled 2017-12-08 (×9): qty 100

## 2017-12-08 MED ORDER — TIZANIDINE HCL 2 MG PO TABS
2.0000 mg | ORAL_TABLET | Freq: Every day | ORAL | Status: DC
  Administered 2017-12-08 – 2017-12-10 (×3): 2 mg via ORAL
  Filled 2017-12-08 (×4): qty 1

## 2017-12-08 MED ORDER — SURGIFOAM 100 EX MISC
CUTANEOUS | Status: DC | PRN
  Administered 2017-12-08: 20 mL via TOPICAL

## 2017-12-08 MED ORDER — PANTOPRAZOLE SODIUM 40 MG IV SOLR
40.0000 mg | INTRAVENOUS | Status: DC
  Administered 2017-12-08 – 2017-12-09 (×2): 40 mg via INTRAVENOUS
  Filled 2017-12-08 (×2): qty 40

## 2017-12-08 MED ORDER — SCOPOLAMINE 1 MG/3DAYS TD PT72
MEDICATED_PATCH | TRANSDERMAL | Status: AC
Start: 2017-12-08 — End: 2017-12-08
  Filled 2017-12-08: qty 1

## 2017-12-08 MED ORDER — 0.9 % SODIUM CHLORIDE (POUR BTL) OPTIME
TOPICAL | Status: DC | PRN
  Administered 2017-12-08: 1000 mL

## 2017-12-08 MED ORDER — ACETAMINOPHEN 325 MG PO TABS
650.0000 mg | ORAL_TABLET | ORAL | Status: DC | PRN
  Administered 2017-12-10: 650 mg via ORAL
  Filled 2017-12-08: qty 2

## 2017-12-08 MED ORDER — OXYCODONE HCL 5 MG PO TABS: 5.0000 mg | ORAL_TABLET | Freq: Once | ORAL | Status: DC | PRN

## 2017-12-08 MED ORDER — OXYCODONE HCL 5 MG/5ML PO SOLN: 5.0000 mg | Freq: Once | ORAL | Status: DC | PRN

## 2017-12-08 MED ORDER — VANCOMYCIN HCL 1000 MG IV SOLR
INTRAVENOUS | Status: DC | PRN
  Administered 2017-12-08: 1000 mg via TOPICAL

## 2017-12-08 MED ORDER — CALCIUM-MAGNESIUM-ZINC PO TABS: 3.0000 | ORAL_TABLET | Freq: Every day | ORAL | Status: DC

## 2017-12-08 MED ORDER — VANCOMYCIN HCL IN DEXTROSE 1-5 GM/200ML-% IV SOLN
1000.0000 mg | INTRAVENOUS | Status: AC
  Administered 2017-12-08: 1000 mg via INTRAVENOUS
  Filled 2017-12-08: qty 200

## 2017-12-08 MED ORDER — SENNOSIDES-DOCUSATE SODIUM 8.6-50 MG PO TABS: 1.0000 | ORAL_TABLET | Freq: Every evening | ORAL | Status: DC | PRN

## 2017-12-08 MED ORDER — CYCLOBENZAPRINE HCL 10 MG PO TABS
10.0000 mg | ORAL_TABLET | Freq: Three times a day (TID) | ORAL | Status: DC | PRN
  Administered 2017-12-08: 10 mg via ORAL

## 2017-12-08 MED ORDER — VANCOMYCIN HCL 1000 MG IV SOLR
INTRAVENOUS | Status: AC
  Filled 2017-12-08: qty 1000

## 2017-12-08 MED ORDER — ROPINIROLE HCL 1 MG PO TABS
2.0000 mg | ORAL_TABLET | Freq: Every day | ORAL | Status: DC
  Administered 2017-12-08 – 2017-12-10 (×3): 2 mg via ORAL
  Filled 2017-12-08 (×3): qty 2

## 2017-12-08 MED ORDER — LABETALOL HCL 5 MG/ML IV SOLN
INTRAVENOUS | Status: DC | PRN
  Administered 2017-12-08 (×3): 5 mg via INTRAVENOUS

## 2017-12-08 MED ORDER — ACETAMINOPHEN 650 MG RE SUPP: 650.0000 mg | RECTAL | Status: DC | PRN

## 2017-12-08 MED ORDER — CHLORHEXIDINE GLUCONATE CLOTH 2 % EX PADS: 6.0000 | MEDICATED_PAD | Freq: Once | CUTANEOUS | Status: DC

## 2017-12-08 MED ORDER — THROMBIN 20000 UNITS EX SOLR
CUTANEOUS | Status: AC
  Filled 2017-12-08: qty 20000

## 2017-12-08 MED ORDER — HYDROCHLOROTHIAZIDE 12.5 MG PO CAPS: 12.5000 mg | ORAL_CAPSULE | Freq: Every day | ORAL | Status: DC | PRN

## 2017-12-08 MED ORDER — GABAPENTIN 400 MG PO CAPS
400.0000 mg | ORAL_CAPSULE | Freq: Every day | ORAL | Status: DC
  Administered 2017-12-08: 800 mg via ORAL
  Administered 2017-12-09: 400 mg via ORAL
  Administered 2017-12-10: 800 mg via ORAL
  Filled 2017-12-08: qty 1
  Filled 2017-12-08: qty 2
  Filled 2017-12-08: qty 1

## 2017-12-08 MED ORDER — DIPHENHYDRAMINE HCL 25 MG PO CAPS: 25.0000 mg | ORAL_CAPSULE | Freq: Every evening | ORAL | Status: DC | PRN

## 2017-12-08 MED ORDER — ROCURONIUM BROMIDE 10 MG/ML (PF) SYRINGE
PREFILLED_SYRINGE | INTRAVENOUS | Status: AC
  Filled 2017-12-08: qty 15

## 2017-12-08 MED ORDER — MENTHOL 3 MG MT LOZG: 1.0000 | LOZENGE | OROMUCOSAL | Status: DC | PRN

## 2017-12-08 MED ORDER — ONDANSETRON HCL 4 MG PO TABS: 4.0000 mg | ORAL_TABLET | Freq: Four times a day (QID) | ORAL | Status: DC | PRN

## 2017-12-08 MED ORDER — LIDOCAINE 2% (20 MG/ML) 5 ML SYRINGE
INTRAMUSCULAR | Status: DC | PRN
  Administered 2017-12-08: 100 mg via INTRAVENOUS

## 2017-12-08 MED ORDER — BUPIVACAINE LIPOSOME 1.3 % IJ SUSP
20.0000 mL | INTRAMUSCULAR | Status: AC
  Administered 2017-12-08: 20 mL
  Filled 2017-12-08: qty 20

## 2017-12-08 MED ORDER — LACTATED RINGERS IV SOLN
INTRAVENOUS | Status: DC | PRN
  Administered 2017-12-08 (×2): via INTRAVENOUS

## 2017-12-08 MED ORDER — PHENYLEPHRINE 40 MCG/ML (10ML) SYRINGE FOR IV PUSH (FOR BLOOD PRESSURE SUPPORT)
PREFILLED_SYRINGE | INTRAVENOUS | Status: AC
Start: 2017-12-08 — End: 2017-12-08
  Filled 2017-12-08: qty 20

## 2017-12-08 MED ORDER — PROPOFOL 500 MG/50ML IV EMUL
INTRAVENOUS | Status: DC | PRN
  Administered 2017-12-08: 16:00:00 via INTRAVENOUS
  Administered 2017-12-08: 75 ug/kg/min via INTRAVENOUS
  Administered 2017-12-08: 14:00:00 via INTRAVENOUS

## 2017-12-08 MED ORDER — THROMBIN 5000 UNITS EX SOLR
CUTANEOUS | Status: AC
  Filled 2017-12-08: qty 5000

## 2017-12-08 MED ORDER — PHENYLEPHRINE HCL 10 MG/ML IJ SOLN
INTRAMUSCULAR | Status: DC | PRN
  Administered 2017-12-08: 120 ug via INTRAVENOUS

## 2017-12-08 MED ORDER — LIDOCAINE-EPINEPHRINE 1 %-1:100000 IJ SOLN
INTRAMUSCULAR | Status: AC
  Filled 2017-12-08: qty 1

## 2017-12-08 MED ORDER — BUPIVACAINE HCL (PF) 0.25 % IJ SOLN
INTRAMUSCULAR | Status: AC
  Filled 2017-12-08: qty 30

## 2017-12-08 MED ORDER — FENTANYL CITRATE (PF) 250 MCG/5ML IJ SOLN
INTRAMUSCULAR | Status: DC | PRN
  Administered 2017-12-08 (×2): 50 ug via INTRAVENOUS
  Administered 2017-12-08: 100 ug via INTRAVENOUS
  Administered 2017-12-08: 50 ug via INTRAVENOUS
  Administered 2017-12-08: 100 ug via INTRAVENOUS
  Administered 2017-12-08: 50 ug via INTRAVENOUS

## 2017-12-08 MED ORDER — DOCUSATE SODIUM 100 MG PO CAPS
100.0000 mg | ORAL_CAPSULE | Freq: Two times a day (BID) | ORAL | Status: DC
  Administered 2017-12-08 – 2017-12-11 (×6): 100 mg via ORAL
  Filled 2017-12-08 (×6): qty 1

## 2017-12-08 MED ORDER — RISAQUAD PO CAPS
1.0000 | ORAL_CAPSULE | Freq: Every day | ORAL | Status: DC
  Administered 2017-12-08 – 2017-12-11 (×4): 1 via ORAL
  Filled 2017-12-08 (×5): qty 1

## 2017-12-08 MED ORDER — PHENYLEPHRINE HCL 10 MG/ML IJ SOLN
INTRAVENOUS | Status: DC | PRN
  Administered 2017-12-08: 30 ug/min via INTRAVENOUS

## 2017-12-08 MED ORDER — SODIUM CHLORIDE 0.9 % IV SOLN
INTRAVENOUS | Status: DC | PRN
  Administered 2017-12-08: 500 mL

## 2017-12-08 MED ORDER — SODIUM CHLORIDE 0.9 % IV SOLN
250.0000 mL | INTRAVENOUS | Status: DC
  Administered 2017-12-08: 250 mL via INTRAVENOUS
  Administered 2017-12-08: 22:00:00 via INTRAVENOUS

## 2017-12-08 MED ORDER — ALUM & MAG HYDROXIDE-SIMETH 200-200-20 MG/5ML PO SUSP: 30.0000 mL | Freq: Four times a day (QID) | ORAL | Status: DC | PRN

## 2017-12-08 MED ORDER — OXYCODONE HCL 5 MG PO TABS
ORAL_TABLET | ORAL | Status: AC
  Filled 2017-12-08: qty 2

## 2017-12-08 MED ORDER — IRBESARTAN 75 MG PO TABS
37.5000 mg | ORAL_TABLET | Freq: Every day | ORAL | Status: DC
  Administered 2017-12-08 – 2017-12-11 (×4): 37.5 mg via ORAL
  Filled 2017-12-08 (×4): qty 0.5

## 2017-12-08 MED ORDER — ONDANSETRON HCL 4 MG/2ML IJ SOLN: 4.0000 mg | Freq: Once | INTRAMUSCULAR | Status: DC | PRN

## 2017-12-08 MED ORDER — PHENOL 1.4 % MT LIQD: 1.0000 | OROMUCOSAL | Status: DC | PRN

## 2017-12-08 MED ORDER — SUGAMMADEX SODIUM 200 MG/2ML IV SOLN
INTRAVENOUS | Status: AC
  Filled 2017-12-08: qty 2

## 2017-12-08 MED ORDER — SCOPOLAMINE 1 MG/3DAYS TD PT72
MEDICATED_PATCH | TRANSDERMAL | Status: DC | PRN
  Administered 2017-12-08: 1 via TRANSDERMAL

## 2017-12-08 MED ORDER — SODIUM CHLORIDE 0.9% FLUSH
3.0000 mL | Freq: Two times a day (BID) | INTRAVENOUS | Status: DC
  Administered 2017-12-08 – 2017-12-11 (×6): 3 mL via INTRAVENOUS

## 2017-12-08 MED ORDER — LIDOCAINE HCL (CARDIAC) 20 MG/ML IV SOLN
INTRAVENOUS | Status: AC
  Filled 2017-12-08: qty 10

## 2017-12-08 MED ORDER — FENTANYL CITRATE (PF) 100 MCG/2ML IJ SOLN
25.0000 ug | INTRAMUSCULAR | Status: DC | PRN
  Administered 2017-12-08: 50 ug via INTRAVENOUS

## 2017-12-08 MED ORDER — BIOTIN 5 MG PO CAPS: 5.0000 mg | ORAL_CAPSULE | Freq: Every day | ORAL | Status: DC

## 2017-12-08 MED ORDER — EPHEDRINE SULFATE 50 MG/ML IJ SOLN
INTRAMUSCULAR | Status: DC | PRN
  Administered 2017-12-08: 5 mg via INTRAVENOUS
  Administered 2017-12-08: 15 mg via INTRAVENOUS

## 2017-12-08 MED ORDER — FENTANYL CITRATE (PF) 250 MCG/5ML IJ SOLN
INTRAMUSCULAR | Status: AC
  Filled 2017-12-08: qty 5

## 2017-12-08 MED ORDER — SODIUM CHLORIDE 0.9% FLUSH
3.0000 mL | INTRAVENOUS | Status: DC | PRN
  Administered 2017-12-08 (×2): 3 mL via INTRAVENOUS
  Filled 2017-12-08 (×2): qty 3

## 2017-12-08 MED ORDER — LACTATED RINGERS IV SOLN
INTRAVENOUS | Status: DC
  Administered 2017-12-08: 11:00:00 via INTRAVENOUS

## 2017-12-08 MED ORDER — CYCLOBENZAPRINE HCL 10 MG PO TABS
ORAL_TABLET | ORAL | Status: AC
  Filled 2017-12-08: qty 1

## 2017-12-08 MED ORDER — PANTOPRAZOLE SODIUM 40 MG PO TBEC
40.0000 mg | DELAYED_RELEASE_TABLET | Freq: Every day | ORAL | Status: DC
  Administered 2017-12-08: 40 mg via ORAL

## 2017-12-08 MED ORDER — ONDANSETRON HCL 4 MG/2ML IJ SOLN
INTRAMUSCULAR | Status: DC | PRN
  Administered 2017-12-08: 4 mg via INTRAVENOUS

## 2017-12-08 MED ORDER — CYCLOBENZAPRINE HCL 10 MG PO TABS: 10.0000 mg | ORAL_TABLET | Freq: Every evening | ORAL | Status: DC | PRN

## 2017-12-08 MED ORDER — ONDANSETRON HCL 4 MG/2ML IJ SOLN: 4.0000 mg | Freq: Four times a day (QID) | INTRAMUSCULAR | Status: DC | PRN

## 2017-12-08 MED ORDER — SUGAMMADEX SODIUM 200 MG/2ML IV SOLN
INTRAVENOUS | Status: DC | PRN
  Administered 2017-12-08: 200 mg via INTRAVENOUS

## 2017-12-08 MED ORDER — FENTANYL CITRATE (PF) 100 MCG/2ML IJ SOLN
INTRAMUSCULAR | Status: AC
  Filled 2017-12-08: qty 2

## 2017-12-08 MED ORDER — HYDROMORPHONE HCL 1 MG/ML IJ SOLN: 0.5000 mg | INTRAMUSCULAR | Status: DC | PRN

## 2017-12-08 MED ORDER — PANTOPRAZOLE SODIUM 40 MG IV SOLR: 40.0000 mg | Freq: Every day | INTRAVENOUS | Status: DC

## 2017-12-08 SURGICAL SUPPLY — 87 items
BAG DECANTER FOR FLEXI CONT (MISCELLANEOUS) ×2 IMPLANT
BENZOIN TINCTURE PRP APPL 2/3 (GAUZE/BANDAGES/DRESSINGS) ×2 IMPLANT
BLADE CLIPPER SURG (BLADE) IMPLANT
BLADE SURG 11 STRL SS (BLADE) ×2 IMPLANT
BNDG GAUZE ELAST 4 BULKY (GAUZE/BANDAGES/DRESSINGS) ×2 IMPLANT
BONE VIVIGEN FORMABLE 5.4CC (Bone Implant) ×2 IMPLANT
BUR CUTTER 7.0 ROUND (BURR) ×2 IMPLANT
BUR MATCHSTICK NEURO 3.0 LAGG (BURR) ×2 IMPLANT
CANISTER SUCT 3000ML PPV (MISCELLANEOUS) ×2 IMPLANT
CAP LOCKING (Cap) ×4 IMPLANT
CAP LOCKING 5.5 CREO (Cap) ×4 IMPLANT
CAP LOCKING THREADED (Cap) ×12 IMPLANT
CARTRIDGE OIL MAESTRO DRILL (MISCELLANEOUS) ×1 IMPLANT
CONNECTOR CREO 35MM HEAD OFFST (Connector) ×4 IMPLANT
CONT SPEC 4OZ CLIKSEAL STRL BL (MISCELLANEOUS) ×2 IMPLANT
COVER BACK TABLE 60X90IN (DRAPES) ×2 IMPLANT
DECANTER SPIKE VIAL GLASS SM (MISCELLANEOUS) ×2 IMPLANT
DERMABOND ADVANCED (GAUZE/BANDAGES/DRESSINGS) ×1
DERMABOND ADVANCED .7 DNX12 (GAUZE/BANDAGES/DRESSINGS) ×1 IMPLANT
DIFFUSER DRILL AIR PNEUMATIC (MISCELLANEOUS) ×2 IMPLANT
DRAPE C-ARM 42X72 X-RAY (DRAPES) IMPLANT
DRAPE C-ARMOR (DRAPES) IMPLANT
DRAPE HALF SHEET 40X57 (DRAPES) ×2 IMPLANT
DRAPE LAPAROTOMY 100X72X124 (DRAPES) ×2 IMPLANT
DRAPE SCAN PATIENT (DRAPES) ×2 IMPLANT
DRAPE SURG 17X23 STRL (DRAPES) ×2 IMPLANT
DRSG OPSITE 4X5.5 SM (GAUZE/BANDAGES/DRESSINGS) IMPLANT
DRSG OPSITE POSTOP 4X10 (GAUZE/BANDAGES/DRESSINGS) ×2 IMPLANT
DRSG OPSITE POSTOP 4X8 (GAUZE/BANDAGES/DRESSINGS) IMPLANT
DURAPREP 26ML APPLICATOR (WOUND CARE) ×2 IMPLANT
ELECT BLADE 4.0 EZ CLEAN MEGAD (MISCELLANEOUS) ×2
ELECT REM PT RETURN 9FT ADLT (ELECTROSURGICAL) ×2
ELECTRODE BLDE 4.0 EZ CLN MEGD (MISCELLANEOUS) ×1 IMPLANT
ELECTRODE REM PT RTRN 9FT ADLT (ELECTROSURGICAL) ×1 IMPLANT
EVACUATOR 3/16  PVC DRAIN (DRAIN) ×2
EVACUATOR 3/16 PVC DRAIN (DRAIN) ×2 IMPLANT
FIBER BOAT ALLOFUSE 15CC (Bone Implant) ×2 IMPLANT
GAUZE SPONGE 4X4 12PLY STRL (GAUZE/BANDAGES/DRESSINGS) IMPLANT
GAUZE SPONGE 4X4 16PLY XRAY LF (GAUZE/BANDAGES/DRESSINGS) ×2 IMPLANT
GLOVE BIO SURGEON STRL SZ 6.5 (GLOVE) ×8 IMPLANT
GLOVE BIO SURGEON STRL SZ7 (GLOVE) IMPLANT
GLOVE BIO SURGEON STRL SZ8 (GLOVE) ×4 IMPLANT
GLOVE BIOGEL PI IND STRL 6.5 (GLOVE) ×5 IMPLANT
GLOVE BIOGEL PI IND STRL 7.0 (GLOVE) ×1 IMPLANT
GLOVE BIOGEL PI INDICATOR 6.5 (GLOVE) ×5
GLOVE BIOGEL PI INDICATOR 7.0 (GLOVE) ×1
GLOVE EXAM NITRILE LRG STRL (GLOVE) IMPLANT
GLOVE EXAM NITRILE XL STR (GLOVE) IMPLANT
GLOVE EXAM NITRILE XS STR PU (GLOVE) IMPLANT
GLOVE INDICATOR 8.5 STRL (GLOVE) ×4 IMPLANT
GLOVE SURG SS PI 6.0 STRL IVOR (GLOVE) ×8 IMPLANT
GLOVE SURG SS PI 6.5 STRL IVOR (GLOVE) ×4 IMPLANT
GOWN STRL REUS W/ TWL LRG LVL3 (GOWN DISPOSABLE) ×5 IMPLANT
GOWN STRL REUS W/ TWL XL LVL3 (GOWN DISPOSABLE) ×5 IMPLANT
GOWN STRL REUS W/TWL 2XL LVL3 (GOWN DISPOSABLE) IMPLANT
GOWN STRL REUS W/TWL LRG LVL3 (GOWN DISPOSABLE) ×5
GOWN STRL REUS W/TWL XL LVL3 (GOWN DISPOSABLE) ×5
HEMOSTAT POWDER KIT SURGIFOAM (HEMOSTASIS) IMPLANT
KIT BASIN OR (CUSTOM PROCEDURE TRAY) ×2 IMPLANT
KIT INFUSE MEDIUM (Orthopedic Implant) ×2 IMPLANT
KIT POSITION SURG JACKSON T1 (MISCELLANEOUS) ×2 IMPLANT
KIT TURNOVER KIT B (KITS) ×2 IMPLANT
MARKER SPHERE PSV REFLC 13MM (MARKER) ×10 IMPLANT
MILL MEDIUM DISP (BLADE) IMPLANT
NEEDLE HYPO 21X1.5 SAFETY (NEEDLE) ×2 IMPLANT
NEEDLE HYPO 25X1 1.5 SAFETY (NEEDLE) ×2 IMPLANT
NS IRRIG 1000ML POUR BTL (IV SOLUTION) ×2 IMPLANT
OIL CARTRIDGE MAESTRO DRILL (MISCELLANEOUS) ×2
PACK LAMINECTOMY NEURO (CUSTOM PROCEDURE TRAY) ×2 IMPLANT
PAD ARMBOARD 7.5X6 YLW CONV (MISCELLANEOUS) ×16 IMPLANT
ROD CREO 125MM SPINAL (Rod) ×2 IMPLANT
ROD SPINE CVD CREO 5.5X150 (Rod) ×2 IMPLANT
SCREW CREO THREADED 6.5X45 (Screw) ×4 IMPLANT
SCREW CREO THREADED 8.5X75 (Screw) ×4 IMPLANT
SPONGE LAP 4X18 X RAY DECT (DISPOSABLE) IMPLANT
SPONGE SURGIFOAM ABS GEL 100 (HEMOSTASIS) ×4 IMPLANT
STRIP CLOSURE SKIN 1/2X4 (GAUZE/BANDAGES/DRESSINGS) ×2 IMPLANT
SUT VIC AB 0 CT1 18XCR BRD8 (SUTURE) ×3 IMPLANT
SUT VIC AB 0 CT1 8-18 (SUTURE) ×3
SUT VIC AB 2-0 CT1 18 (SUTURE) ×6 IMPLANT
SUT VIC AB 4-0 PS2 27 (SUTURE) ×4 IMPLANT
SYR 20CC LL (SYRINGE) ×2 IMPLANT
SYR CONTROL 10ML LL (SYRINGE) ×2 IMPLANT
TOWEL GREEN STERILE (TOWEL DISPOSABLE) ×2 IMPLANT
TOWEL GREEN STERILE FF (TOWEL DISPOSABLE) ×2 IMPLANT
TRAY FOLEY W/METER SILVER 16FR (SET/KITS/TRAYS/PACK) ×2 IMPLANT
WATER STERILE IRR 1000ML POUR (IV SOLUTION) ×2 IMPLANT

## 2017-12-08 NOTE — Anesthesia Procedure Notes (Addendum)
Procedure Name: Intubation Date/Time: 12/08/2017 12:32 PM Performed by: Beryle LatheBrock, Thomas E, MD Pre-anesthesia Checklist: Patient identified, Emergency Drugs available, Suction available and Patient being monitored Patient Re-evaluated:Patient Re-evaluated prior to induction Oxygen Delivery Method: Circle System Utilized Preoxygenation: Pre-oxygenation with 100% oxygen Induction Type: IV induction Ventilation: Mask ventilation without difficulty and Oral airway inserted - appropriate to patient size Laryngoscope Size: Hyacinth MeekerMiller and 2 Grade View: Grade III Tube type: Oral Tube size: 7.0 mm Number of attempts: 2 Airway Equipment and Method: Stylet and Oral airway Placement Confirmation: ETT inserted through vocal cords under direct vision,  positive ETCO2 and breath sounds checked- equal and bilateral Secured at: 22 cm Tube secured with: Tape Dental Injury: Teeth and Oropharynx as per pre-operative assessment

## 2017-12-08 NOTE — H&P (Signed)
Joyce Kelly is an 72 y.o. female.   Chief Complaint: back bilateral hip pain HPI: Joyce Kelly is a very pleasant 72 year old female is a long-standing back pain with previously done an L4-S1 fusion and she did very well initially however start deteriorating and having progressive worsening back pain and underwent an L3 for fusion on top of that. 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 On exam patient has all her pain localized around the lumbosacral area at L5-S1 and thepseudarthrosis and has broken S1 screw. She does not have clear radiographic evidence of solid fusion L3 for either. She does have some mild tenderness disease and scoliotic deformity at L1-2 and L2-3 however do not that she's symptomatically her. 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 I've extensively gone over the risks and benefits of that operation with her as well as perioperative course expectations of outcome and alternatives surgery and she understands and agrees to proceed forward.  Past Medical History:  Diagnosis Date  . Anemia   . Arthritis   . Depression   . Diarrhea   . Fatty liver   . Fibromyalgia   . GERD (gastroesophageal reflux disease)   . Hypertension   . Insomnia    per pt "very severe"  . Neuropathy    both feet  . Pneumonia   . PONV (postoperative nausea and vomiting)   . Restless legs syndrome (RLS)   . Shortness of breath dyspnea    with exertion    Past Surgical History:  Procedure Laterality Date  . ABDOMINAL HYSTERECTOMY     partial  . BRAIN SURGERY     Cerebral aneurysm - has clips   . CARPAL TUNNEL RELEASE Right   . CHOLECYSTECTOMY    . COLONOSCOPY    . KNEE  ARTHROPLASTY Right   . KNEE ARTHROSCOPY Right   . thumb surgery Left   . TONSILLECTOMY    . TUBAL LIGATION      Family History  Problem Relation Age of Onset  . Thyroid disease Mother   . Hypertension Mother   . Lung disease Father    Social History:  reports that she quit smoking about 35 years ago. She has never used smokeless tobacco. She reports that she drinks alcohol. She reports that she does not use drugs.  Allergies:  Allergies  Allergen Reactions  . Meloxicam Diarrhea and Shortness Of Breath  . Pneumococcal Vac Polyvalent Swelling  . Pregabalin Swelling  . Codeine Nausea And Vomiting  . Amoxicillin     Passed out Has patient had a PCN reaction causing immediate rash, facial/tongue/throat swelling, SOB or lightheadedness with hypotension: Yes Has patient had a PCN reaction causing severe rash involving mucus membranes or skin necrosis: No Has patient had a PCN reaction that required hospitalization: Yes Has patient had a PCN reaction occurring within the last 10 years: Yes If all of the above answers are "NO", then may proceed with Cephalosporin use.   . Clindamycin/Lincomycin     May have caused c diff   . Diclofenac Sodium Other (See Comments)    Tears up stomach   . Erythromycin     Made skin feel crazy   . Lisinopril     Cough  .  Nsaids     Tears up stomach  . Tramadol Other (See Comments)    Headache  . Valsartan Other (See Comments)    Headache, leg cramps    Medications Prior to Admission  Medication Sig Dispense Refill  . Biotin (BIOTIN 5000) 5 MG CAPS Take 5 mg by mouth daily.    . cyclobenzaprine (FLEXERIL) 10 MG tablet Take 10 mg by mouth at bedtime as needed for muscle spasms.    . diphenhydrAMINE (BENADRYL) 25 mg capsule Take 25 mg by mouth at bedtime as needed for sleep.     Marland Kitchen gabapentin (NEURONTIN) 100 MG capsule Take 400-600 mg by mouth at bedtime.     . hydrochlorothiazide (MICROZIDE) 12.5 MG capsule Take 12.5 mg by mouth daily as needed  (swelling).     . Multiple Minerals (CALCIUM-MAGNESIUM-ZINC) TABS Take 3 tablets by mouth daily.     Marland Kitchen olmesartan (BENICAR) 40 MG tablet Take 40 mg by mouth daily.    Marland Kitchen omeprazole (PRILOSEC) 20 MG capsule Take 20 mg by mouth daily as needed (acid reflux).    Marland Kitchen OVER THE COUNTER MEDICATION Take 1 tablet by mouth daily. Circulegs otc supplement    . potassium chloride (K-DUR,KLOR-CON) 10 MEQ tablet Take 20 mEq by mouth daily.    . Probiotic CAPS Take 1 capsule by mouth daily.    Marland Kitchen rOPINIRole (REQUIP) 2 MG tablet Take 2 mg by mouth at bedtime. May take a second 2 mg dose as needed for restless legs    . tiZANidine (ZANAFLEX) 2 MG tablet Take 2 mg by mouth at bedtime.    . traZODone (DESYREL) 150 MG tablet Take 300 mg by mouth at bedtime.      No results found for this or any previous visit (from the past 48 hour(s)). No results found.  Review of Systems  Musculoskeletal: Positive for back pain and myalgias.  Neurological: Positive for tingling and sensory change.    Blood pressure (!) 149/55, pulse 61, temperature 97.6 F (36.4 C), temperature source Oral, resp. rate 18, height 5\' 5"  (1.651 m), weight 98.4 kg (217 lb), SpO2 100 %. Physical Exam  Constitutional: She is oriented to person, place, and time. She appears well-developed.  HENT:  Head: Normocephalic.  Eyes: Pupils are equal, round, and reactive to light.  Neck: Normal range of motion.  Respiratory: Effort normal.  GI: Soft.  Neurological: She is alert and oriented to person, place, and time. She has normal strength. GCS eye subscore is 4. GCS verbal subscore is 5. GCS motor subscore is 6.  Strength is 5 out of 5 iliopsoas, quads, hbialis, and EHL.  Skin: Skin is warm and dry.     Assessment/Plan 72 year old female presents for redo posterior lateral fusion with iliac crest bone graft L5-S1 possibly L3-4.  Joyce Kelly P, MD 12/08/2017, 12:12 PM

## 2017-12-08 NOTE — Op Note (Signed)
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 5 6.5 x 45 mm screws  #2 pelvic fixation utilizing stereotactic navigation for placement of bilateral iliac screws globus 8.5 x 75 mm screws  #3 posterior lateral arthrodesis L3-4 L5-S1 utilizing iliac crest bone graft, BMP and vivigen as well as cortical fiber boats from [employer]  #4 through a separate skin incision harvesting of left iliac crest bone graft  Surgeon: Jillyn HiddenGary Takayla Baillie  Asst.: Marikay Alaravid Jones  Anesthesia: Gen.  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 Due to patient progressive conical syndrome failure conservative treatment I recommended reexpansion of fusion removal of hardware and removal of S1 screws with pelvic fixation. I extensively went over the risks and benefits of that operation with her as well as perioperative course expectations of outcome alternatives of surgery and she understood and agreed to proceed forward.  Operative procedure: Patient was induced on general anesthesia positioned prone [facility] the [facility] her back was prepped and draped in routine sterile fashion and midline incision was drawn out extending her previous incision slightly cephalad caudally and then 4 fingerbreadths off the midline incision was drawn out over the left posterior superior iliac rest. Both incisions were infiltrated with 10 mL lidocaine with epi and attention was first taken  harvesting of the iliac crest bone graft so dissection was carried out along the posterior aspect of the iliac crest and then using comminution chisels and gouges a moderate amount of iliac crest cortical cancellus bone was harvested at the end of the harvest packed with Gelfoam and closed with interrupted Vicryl and a running 4 subcuticular in the skin Dermabond benzo and Steri-Strips were applied and this was draped away from the field. Then the midline incision was made and Bovie light car was used to dissect through the scar tissue the hardware was immediately identified and exposed I also exposed the lamina of L2. I carried the incision down inferiorly across the sacral a lot down about the level of S2 and then I dissected out through a separate fascial incision the posterior superior iliac spine. Identified my entry point for the lag screws. I disconnected all the hardware inspected the fusion. She was pseudoarthrosis L5-S1 as well as L3-4 but appeared to have a solid fusion at L4-5. So after adequate exposure been achieved I placed the reference array over the L2 spinous process then the patient was sent through the interoperative CT scanner and registered. Then utilizing the stereotactic navigation system I used the awl to cannulate first the right-sided iliac screw and utilizing 3-dimensional CT imaging placed in the correct spot selected an 8.5 mm x 75 mm screw the screw had excellent purchase and I navigated both the awl and the tap and then place a screw freehand through the hole. A similar fashion placed the left-sided iliac screw postop CT scan good and show good position of all the hardware so removed the reference a Metter and disconnected the stereotactic navigation system. Then I sized the parotid and realized the  rod would not Mary up with the cobalt chrome L3 screw*moved bilateral L3 screws and replaced them with hydroxyapatite screws globus. Then 150 mm rod was selected for the patient's right  side 125 mm for the patient's left anchored it in the previous screws as well as a new iliac screws through a connector passed subfascially. I did have to remove the left-sided L4 screw as it was too medially placed to [facility] up in the rod. Then prior to rod placement I aggressively decorticated the TPs at L3 and L4 as well as L5-S1 and the sacral alar. Then placed the rods anchored all the knots in place anchored down the iliac screw as well as a connector and the rods and all the previous pre-existing screws in the replaced to hydroxyapatite screws then packed BMP, iliac crest bone graft, vivigen and a cortical fiber boats posterior laterally from L3-L4 and L5-S1. Then the wound scope was irrigated meticulous hemostasis was maintained a large Hemovac drain was placed I sprinkled vancomycin powder into the wound to close the fascia sprinkle vancomycin powder in the extra fascial space then closed the subcutaneous tissue and the skin with a running 4 subcuticular. Dermabond benzo and Steri-Strips and a sterile dressing was applied and patient recovered in stable condition. At the end the case on it counts sponge counts were correct.

## 2017-12-08 NOTE — Anesthesia Preprocedure Evaluation (Addendum)
Anesthesia Evaluation  Patient identified by MRN, date of birth, ID band Patient awake    Reviewed: Allergy & Precautions, NPO status , Patient's Chart, lab work & pertinent test results  History of Anesthesia Complications (+) PONV and history of anesthetic complications  Airway Mallampati: II  TM Distance: <3 FB Neck ROM: Full    Dental  (+) Teeth Intact, Dental Advisory Given   Pulmonary former smoker,    Pulmonary exam normal breath sounds clear to auscultation       Cardiovascular Exercise Tolerance: Good hypertension, Pt. on medications Normal cardiovascular exam Rhythm:Regular Rate:Normal     Neuro/Psych PSYCHIATRIC DISORDERS Depression RLS; Insomnia; Neuropathy b/l feet    GI/Hepatic Neg liver ROS, GERD  Medicated and Controlled,  Endo/Other  Obesity  Renal/GU negative Renal ROS     Musculoskeletal  (+) Arthritis , Osteoarthritis,  Fibromyalgia -  Abdominal   Peds  Hematology negative hematology ROS (+)   Anesthesia Other Findings   Reproductive/Obstetrics                             Anesthesia Physical  Anesthesia Plan  ASA: II  Anesthesia Plan: General   Post-op Pain Management:    Induction: Intravenous  PONV Risk Score and Plan: 4 or greater and Treatment may vary due to age or medical condition, Ondansetron and TIVA  Airway Management Planned: Oral ETT and Video Laryngoscope Planned  Additional Equipment: None  Intra-op Plan:   Post-operative Plan: Extubation in OR  Informed Consent: I have reviewed the patients History and Physical, chart, labs and discussed the procedure including the risks, benefits and alternatives for the proposed anesthesia with the patient or authorized representative who has indicated his/her understanding and acceptance.   Dental advisory given  Plan Discussed with: CRNA and Anesthesiologist  Anesthesia Plan Comments: (Will plan  TIVA due to history of PONV.)        Anesthesia Quick Evaluation

## 2017-12-09 NOTE — Evaluation (Signed)
Physical Therapy Evaluation Patient Details Name: Joyce Kelly MRN: [id number] DOB: 06/06/1946 Today's Date: 12/09/2017   History of Present Illness  Pt is 72 year old female is a long-standing back pain with previously done an L4-S1 fusion. Now s/p Posterior lumbar fusion 3 levels. PMH includes: Fibromyalgia, neuropathy, dyspnea, R KNEE ARTHROPLASTY, brain surgery (Cerebral aneurysm - has clips ).   Clinical Impression  Pt is up to walk after instruction for safely getting to bedside, then to don brace.  Her plan is to go home with family assisting her, to continue on with instructions for protection of spine (reviewed handout).  Pt knows 3/3 precautions, educated about lifting limits.  Her acute therapy will be to continue bracing and walking as well as strengthening within precautions.    Follow Up Recommendations Home health PT;Supervision for mobility/OOB    Equipment Recommendations  Other (comment)(planning to borrow shower seat)    Recommendations for Other Services       Precautions / Restrictions Precautions Precautions: Back Precaution Booklet Issued: Yes (comment) Precaution Comments: reviewed precautions Required Braces or Orthoses: Spinal Brace Spinal Brace: Applied in sitting position;Lumbar corset Restrictions Weight Bearing Restrictions: No      Mobility  Bed Mobility Overal bed mobility: Needs Assistance Bed Mobility: Rolling;Sidelying to Sit;Sit to Sidelying Rolling: Min guard Sidelying to sit: Min guard;Min assist     Sit to sidelying: Min assist General bed mobility comments: needed help with LE's to return to bed  Transfers Overall transfer level: Needs assistance Equipment used: Rolling walker (2 wheeled) Transfers: Sit to/from Stand Sit to Stand: Min guard            Ambulation/Gait Ambulation/Gait assistance: Min guard Ambulation Distance (Feet): 250 Feet Assistive device: Rolling walker (2 wheeled) Gait Pattern/deviations: Step-through  pattern;Wide base of support;Decreased stride length Gait velocity: reduced Gait velocity interpretation: Below normal speed for age/gender General Gait Details: careful use of RW with cued postural correction and pacing  Stairs            Wheelchair Mobility    Modified Rankin (Stroke Patients Only)       Balance Overall balance assessment: Needs assistance Sitting-balance support: Feet supported Sitting balance-Leahy Scale: Fair     Standing balance support: Bilateral upper extremity supported;During functional activity Standing balance-Leahy Scale: Fair Standing balance comment: pt observes that her pain is not increased with walk but is tired                             Pertinent Vitals/Pain Pain Assessment: 0-10 Pain Score: 5  Pain Location: back Pain Descriptors / Indicators: Aching;Sore Pain Intervention(s): Limited activity within patient's tolerance;Monitored during session;Premedicated before session;Repositioned;Other (comment)(cinched brace sufficiently to increase good spinal comfort)    Home Living Family/patient expects to be discharged to:: Private residence Living Arrangements: Spouse/significant other Available Help at Discharge: Family Type of Home: House Home Access: Stairs to enter Entrance Stairs-Rails: Right Entrance Stairs-Number of Steps: 7 Home Layout: Two level;Able to live on main level with bedroom/bathroom Home Equipment: Dan HumphreysWalker - 2 wheels      Prior Function Level of Independence: Independent         Comments: has not needed AD despite pain     Hand Dominance        Extremity/Trunk Assessment   Upper Extremity Assessment Upper Extremity Assessment: Overall WFL for tasks assessed    Lower Extremity Assessment Lower Extremity Assessment: Overall WFL for tasks assessed    Cervical /  Trunk Assessment Cervical / Trunk Assessment: Other exceptions(new PLIF with 3 level involvement)  Communication    Communication: No difficulties  Cognition Arousal/Alertness: Awake/alert Behavior During Therapy: WFL for tasks assessed/performed Overall Cognitive Status: Within Functional Limits for tasks assessed                                        General Comments      Exercises     Assessment/Plan    PT Assessment Patient needs continued PT services  PT Problem List Decreased strength;Decreased range of motion;Decreased activity tolerance;Decreased balance;Decreased mobility;Decreased coordination;Decreased knowledge of use of DME;Cardiopulmonary status limiting activity;Obesity;Decreased skin integrity;Pain       PT Treatment Interventions DME instruction;Gait training;Stair training;Functional mobility training;Therapeutic activities;Therapeutic exercise;Balance training;Neuromuscular re-education;Patient/family education    PT Goals (Current goals can be found in the Care Plan section)  Acute Rehab PT Goals Patient Stated Goal: reduce pain and recover strength PT Goal Formulation: With patient Time For Goal Achievement: 12/23/17 Potential to Achieve Goals: Good    Frequency Min 3X/week   Barriers to discharge Decreased caregiver support home alone at times    Co-evaluation               AM-PAC PT "6 Clicks" Daily Activity  Outcome Measure Difficulty turning over in bed (including adjusting bedclothes, sheets and blankets)?: A Little Difficulty moving from lying on back to sitting on the side of the bed? : Unable Difficulty sitting down on and standing up from a chair with arms (e.g., wheelchair, bedside commode, etc,.)?: Unable Help needed moving to and from a bed to chair (including a wheelchair)?: A Little Help needed walking in hospital room?: A Little Help needed climbing 3-5 steps with a railing? : A Lot 6 Click Score: 13    End of Session Equipment Utilized During Treatment: Gait belt;Back brace Activity Tolerance: Patient tolerated treatment  well;Patient limited by fatigue Patient left: in bed;with call bell/phone within reach;with bed alarm set Nurse Communication: Mobility status PT Visit Diagnosis: Unsteadiness on feet (R26.81);Muscle weakness (generalized) (M62.81);Other abnormalities of gait and mobility (R26.89)    Time: 1610-9604 PT Time Calculation (min) (ACUTE ONLY): 23 min   Charges:   PT Evaluation $PT Eval Moderate Complexity: 1 Mod PT Treatments $Gait Training: 8-22 mins   PT G Codes:   PT G-Codes **NOT FOR INPATIENT CLASS** Functional Assessment Tool Used: AM-PAC 6 Clicks Basic Mobility    Ivar Drape 12/09/2017, 3:19 PM   Samul Dada, PT MS Acute Rehab Dept. Number: [facility] [id number] and [facility] [phone]

## 2017-12-09 NOTE — Evaluation (Signed)
Occupational Therapy Evaluation Patient Details Name: Joyce Kelly MRN: [id number] DOB: 03/12/1946 Today's Date: 12/09/2017    History of Present Illness Pt is 72 year old female is a long-standing back pain with previously done an L4-S1 fusion. Now s/p Posterior lumbar fusion 3 levels. PMH includes: Fibromyalgia, neuropathy, dyspnea, R KNEE ARTHROPLASTY, brain surgery (Cerebral aneurysm - has clips ).    Clinical Impression   Pt admitted with the above diagnoses and presents with below problem list. Pt will benefit from continued acute OT to address the below listed deficits and maximize independence with basic ADLs prior to d/c home. PTA pt was independent with ADLs, pt endorses painful ambulation prior to surgery.  Pt is currently min guard with functional transfers and mobility, min A with LB ADLs.      Follow Up Recommendations  Home health OT;Supervision - Intermittent    Equipment Recommendations  Other (comment)(plans to borrow shower seat and 3n1)    Recommendations for Other Services PT consult     Precautions / Restrictions Precautions Precautions: Back Precaution Booklet Issued: Yes (comment) Required Braces or Orthoses: Spinal Brace Spinal Brace: Applied in sitting position;Lumbar corset Restrictions Weight Bearing Restrictions: No      Mobility Bed Mobility Overal bed mobility: Needs Assistance Bed Mobility: Rolling;Sidelying to Sit Rolling: Supervision Sidelying to sit: Supervision       General bed mobility comments: good logroll technique  Transfers Overall transfer level: Needs assistance Equipment used: Rolling walker (2 wheeled) Transfers: Sit to/from Stand Sit to Stand: Min guard         General transfer comment: cues for technique with rw    Balance Overall balance assessment: Needs assistance Sitting-balance support: Feet supported;Bilateral upper extremity supported Sitting balance-Leahy Scale: Fair     Standing balance support: Bilateral  upper extremity supported;During functional activity Standing balance-Leahy Scale: Fair Standing balance comment: pt requesting to use rw prior to standing due to weakness.                            ADL either performed or assessed with clinical judgement   ADL Overall ADL's : Needs assistance/impaired Eating/Feeding: Set up;Sitting   Grooming: Set up;Min guard;Sitting;Standing   Upper Body Bathing: Minimal assistance;Sitting   Lower Body Bathing: Minimal assistance;Sit to/from stand   Upper Body Dressing : Minimal assistance;Sitting   Lower Body Dressing: Minimal assistance   Toilet Transfer: Min guard;Ambulation;RW(3n1 over toilet)   Toileting- Clothing Manipulation and Hygiene: Minimal assistance;Sit to/from stand;Set up;Min guard;Sitting/lateral lean Toileting - Clothing Manipulation Details (indicate cue type and reason): discussed tongs for posterior pericare Tub/ Shower Transfer: Walk-in shower;Min guard;Ambulation;Shower seat;Rolling walker   Functional mobility during ADLs: Min guard;Rolling walker General ADL Comments: Pt completed bed mobility, in-room functional mobility. Difficulty raising feet in seated position to access for LB ADLs. Educated on ADL strategies and AE     Vision         Perception     Praxis      Pertinent Vitals/Pain Pain Assessment: Faces Faces Pain Scale: Hurts little more Pain Location: back Pain Descriptors / Indicators: Aching;Sore Pain Intervention(s): Monitored during session;Repositioned     Hand Dominance     Extremity/Trunk Assessment Upper Extremity Assessment Upper Extremity Assessment: Overall WFL for tasks assessed   Lower Extremity Assessment Lower Extremity Assessment: Defer to PT evaluation       Communication Communication Communication: No difficulties   Cognition Arousal/Alertness: Awake/alert Behavior During Therapy: WFL for tasks assessed/performed Overall Cognitive  Status: Within  Functional Limits for tasks assessed                                     General Comments       Exercises     Shoulder Instructions      Home Living Family/patient expects to be discharged to:: Private residence Living Arrangements: Spouse/significant other Available Help at Discharge: Family Type of Home: House Home Access: Stairs to enter [employer] of Steps: 7 Entrance Stairs-Rails: Right Home Layout: Two level;Able to live on main level with bedroom/bathroom     Bathroom Shower/Tub: Walk-in shower         Home Equipment: (has walker, plans to borrow shower seat and 3n1.)          Prior Functioning/Environment Level of Independence: Independent        Comments: " I walked without using anything but it was very painful."        OT Problem List: Impaired balance (sitting and/or standing);Decreased knowledge of precautions;Decreased knowledge of use of DME or AE;Pain      OT Treatment/Interventions: Self-care/ADL training;DME and/or AE instruction;Therapeutic activities;Patient/family education;Balance training    OT Goals(Current goals can be found in the care plan section) Acute Rehab OT Goals Patient Stated Goal: regain independence, reduced pain with mobility OT Goal Formulation: With patient Time For Goal Achievement: 12/16/17 Potential to Achieve Goals: Good ADL Goals Pt Will Perform Lower Body Bathing: with modified independence;sit to/from stand Pt Will Perform Lower Body Dressing: with modified independence;sit to/from stand Pt Will Transfer to Toilet: with modified independence;ambulating Pt Will Perform Toileting - Clothing Manipulation and hygiene: with modified independence;sit to/from stand;with adaptive equipment Pt Will Perform Tub/Shower Transfer: Shower transfer;ambulating;shower seat;rolling walker  OT Frequency: Min 2X/week   Barriers to D/C:            Co-evaluation              AM-PAC PT "6  Clicks" Daily Activity     Outcome Measure Help from another person eating meals?: None Help from another person taking care of personal grooming?: None Help from another person toileting, which includes using toliet, bedpan, or urinal?: A Little Help from another person bathing (including washing, rinsing, drying)?: A Little Help from another person to put on and taking off regular upper body clothing?: A Little Help from another person to put on and taking off regular lower body clothing?: A Little 6 Click Score: 20   End of Session Equipment Utilized During Treatment: Rolling walker;Back brace  Activity Tolerance: Patient tolerated treatment well Patient left: in chair;with call bell/phone within reach  OT Visit Diagnosis: Unsteadiness on feet (R26.81);Pain                Time: 7829-5621 OT Time Calculation (min): 24 min Charges:  OT General Charges $OT Visit: 1 Visit OT Evaluation $OT Eval Low Complexity: 1 Low OT Treatments $Self Care/Home Management : 8-22 mins G-Codes:       Pilar Grammes 12/09/2017, 9:59 AM

## 2017-12-09 NOTE — Progress Notes (Signed)
NEUROSURGERY PROGRESS NOTE  Doing well. Complains of appropriate back soreness. No leg pain No numbness, tingling or weakness Ambulating and voiding well Good strength and sensation Incision CDI  Temp:  [97.6 F (36.4 C)-98.4 F (36.9 C)] 97.8 F (36.6 C) (04/06 0908) Pulse Rate:  [25-83] 25 (04/06 0908) Resp:  [10-21] 18 (04/06 0908) BP: (94-149)/(38-64) 100/38 (04/06 0908) SpO2:  [92 %-100 %] 93 % (04/06 0908) Weight:  [98.4 kg (217 lb)] 98.4 kg (217 lb) (04/05 1113)  Plan:  Continue to mobilize and pain management   Sherryl MangesKimberly Hannah Meyran, NP 12/09/2017 10:50 AM

## 2017-12-09 NOTE — Progress Notes (Signed)
Called [employer] for Orthopedic Tech Progress Note Patient Details:  Joyce Kelly 12/01/1945 [id number]          Saul FordyceJennifer C Florena Kozma 12/09/2017, 10:21 AMCalled [employer] for corsett.

## 2017-12-10 LAB — URINALYSIS, ROUTINE W REFLEX MICROSCOPIC
Bilirubin Urine: NEGATIVE
Glucose, UA: NEGATIVE mg/dL
Hgb urine dipstick: NEGATIVE
Ketones, ur: NEGATIVE mg/dL
LEUKOCYTES UA: NEGATIVE
NITRITE: NEGATIVE
PROTEIN: NEGATIVE mg/dL
Specific Gravity, Urine: 1.012 (ref 1.005–1.030)
pH: 5 (ref 5.0–8.0)

## 2017-12-10 LAB — GLUCOSE, CAPILLARY: Glucose-Capillary: 128 mg/dL — ABNORMAL HIGH (ref 65–99)

## 2017-12-10 MED ORDER — PANTOPRAZOLE SODIUM 40 MG PO TBEC
40.0000 mg | DELAYED_RELEASE_TABLET | Freq: Every day | ORAL | Status: DC
Start: 2017-12-10 — End: 2017-12-11
  Administered 2017-12-10: 40 mg via ORAL
  Filled 2017-12-10: qty 1

## 2017-12-10 NOTE — Plan of Care (Signed)
  Problem: Pain Management: Goal: Pain level will decrease Outcome: Progressing   Problem: Activity: Goal: Ability to avoid complications of mobility impairment will improve Outcome: Progressing

## 2017-12-10 NOTE — Progress Notes (Signed)
Occupational Therapy Treatment Patient Details Name: Joyce Kelly MRN: [id number] DOB: 07/18/1946 Today's Date: 12/10/2017    History of present illness Pt is 72 year old female is a long-standing back pain with previously done an L4-S1 fusion. Now s/p Posterior lumbar fusion 3 levels. PMH includes: Fibromyalgia, neuropathy, dyspnea, R KNEE ARTHROPLASTY, brain surgery (Cerebral aneurysm - has clips ).    OT comments  Pt. Motivated for participation in skiled OT.  Able to complete bed mobility, amb. To/from b.room for toileting, and LB ADLs with use of A/E.  All at S/Min guard A.  Anticipate d/c home next day or so.  Pt. Will have assistance at home.  Plan for shower transfer next session in preparation for safe return home.   Follow Up Recommendations  Home health OT;Supervision - Intermittent    Equipment Recommendations       Recommendations for Other Services      Precautions / Restrictions Precautions Precautions: Back Precaution Comments: reviewed precautions Required Braces or Orthoses: Spinal Brace Spinal Brace: Applied in sitting position;Lumbar corset Restrictions Weight Bearing Restrictions: No       Mobility Bed Mobility Overal bed mobility: Needs Assistance Bed Mobility: Rolling;Sidelying to Sit;Sit to Sidelying Rolling: Min guard Sidelying to sit: Min guard       General bed mobility comments: hob slightly elevated  Transfers Overall transfer level: Needs assistance Equipment used: Rolling walker (2 wheeled) Transfers: Sit to/from [employer]Stand;Stand Pivot Transfers Sit to Stand: Min guard Stand pivot transfers: Min guard            Balance                                           ADL either performed or assessed with clinical judgement   ADL Overall ADL's : Needs assistance/impaired     Grooming: Wash/dry hands;Brushing hair;Standing;Min guard           Upper Body Dressing : Set up;Sitting Upper Body Dressing Details (indicate cue  type and reason): able to don brace without assistance Lower Body Dressing: Min guard;Cueing for sequencing;With adaptive equipment;Sitting/lateral leans Lower Body Dressing Details (indicate cue type and reason): for donning/doffing pants and socks with reacher and sock aide Toilet Transfer: Min guard;Ambulation;RW Toilet Transfer Details (indicate cue type and reason): 3n1 over the commode Toileting- Clothing Manipulation and Hygiene: Min guard;Sit to/from stand;Sitting/lateral lean;Adhering to back precautions       Functional mobility during ADLs: Min guard;Rolling walker       Vision       Perception     Praxis      Cognition Arousal/Alertness: Awake/alert Behavior During Therapy: WFL for tasks assessed/performed Overall Cognitive Status: Within Functional Limits for tasks assessed                                          Exercises     Shoulder Instructions       General Comments      Pertinent Vitals/ Pain       Pain Assessment: No/denies pain  Home Living                                          Prior Functioning/Environment  Frequency  Min 2X/week        Progress Toward Goals  OT Goals(current goals can now be found in the care plan section)  Progress towards OT goals: Progressing toward goals     Plan      Co-evaluation                 AM-PAC PT "6 Clicks" Daily Activity     Outcome Measure   Help from another person eating meals?: None Help from another person taking care of personal grooming?: None Help from another person toileting, which includes using toliet, bedpan, or urinal?: A Little Help from another person bathing (including washing, rinsing, drying)?: A Little Help from another person to put on and taking off regular upper body clothing?: A Little Help from another person to put on and taking off regular lower body clothing?: A Little 6 Click Score: 20    End of  Session Equipment Utilized During Treatment: Gait belt;Rolling walker;Back brace  OT Visit Diagnosis: Unsteadiness on feet (R26.81);Pain   Activity Tolerance Patient tolerated treatment well   Patient Left in chair;with call bell/phone within reach   Nurse Communication          Time: 1030-1054 OT Time Calculation (min): 24 min  Charges: OT General Charges $OT Visit: 1 Visit OT Treatments $Self Care/Home Management : 23-37 mins   Robet Leu, COTA/L 12/10/2017, 12:38 PM

## 2017-12-10 NOTE — Progress Notes (Signed)
NEUROSURGERY PROGRESS NOTE  Doing okay. Complains of worsening pain today. Ambulating and voiding well Good strength and sensation Incision CDI  Temp:  [97.8 F (36.6 C)-98.8 F (37.1 C)] 98.7 F (37.1 C) (04/07 0742) Pulse Rate:  [66-75] 75 (04/07 0742) Resp:  [16-18] 18 (04/07 0742) BP: (67-136)/(34-57) 119/49 (04/07 0742) SpO2:  [93 %-99 %] 94 % (04/07 0742)  Plan: Worsening of pain is expected. Will consider D/C home tomorrow.   Sherryl MangesKimberly Hannah Meyran, NP 12/10/2017 7:57 AM

## 2017-12-10 NOTE — Care Management Note (Signed)
Case Management Note  Patient Details  Name: Joyce Kelly MRN: [id number] Date of Birth: 04/09/1946  Subjective/Objective:     Pt presented for lumbar surgery.  Pt from home with family.  Pt has RW, 3n1, shower stool.  Pt states she is anticipating doing HH therapy with [facility]. She states they called her to advise they will arrange visit within 2 days after d/c.              Action/Plan: No CM needs at this time.  Will need HH orders upon d/c.    Expected Discharge Date:                  Expected Discharge Plan:  Home w Home Health Services  In-House Referral:  NA  Discharge planning Services  CM Consult  Post Acute Care Choice:  Home Health Choice offered to:  Patient  DME Arranged:    DME Agency:     HH Arranged:    HH Agency:  [facility]  Status of Service:  In process, will continue to follow  If discussed at Long Length of Stay Meetings, dates discussed:    Additional Comments:  Deveron Furlongshley  Darrold Bezek, RN 12/10/2017, 5:38 PM

## 2017-12-10 NOTE — Progress Notes (Signed)
PHARMACIST - PHYSICIAN COMMUNICATION  CONCERNING: IV to Oral Route Change Policy  RECOMMENDATION: This patient is receiving pantoprazole by the intravenous route.  Based on criteria approved by the Pharmacy and Therapeutics Committee, the intravenous medication(s) is/are being converted to the equivalent oral dose form(s).  DESCRIPTION: These criteria include:  The patient is eating (either orally or via tube) and/or has been taking other orally administered medications for a least 24 hours  The patient has no evidence of active gastrointestinal bleeding or impaired GI absorption (gastrectomy, short bowel, patient on TNA or NPO).  If you have questions about this conversion, please contact the Pharmacy Department  []   [phone]( [phone] )  [facility] []   [phone]( [phone] )  [facility] [x]   [phone]( [phone] )  [facility] []   [phone]  [facility] []   [phone]( [phone] )  [facility]   Roderic ScarceErin N. Zigmund Danieleja, PharmD PGY1 Pharmacy Resident Pager: [phone] 12/10/2017 1:17 PM

## 2017-12-11 LAB — CBC WITH DIFFERENTIAL/PLATELET
BASOS PCT: 0 %
Basophils Absolute: 0 10*3/uL (ref 0.0–0.1)
EOS ABS: 0.2 10*3/uL (ref 0.0–0.7)
EOS PCT: 2 %
HCT: 31 % — ABNORMAL LOW (ref 36.0–46.0)
Hemoglobin: 10.2 g/dL — ABNORMAL LOW (ref 12.0–15.0)
Lymphocytes Relative: 33 %
Lymphs Abs: 3.1 10*3/uL (ref 0.7–4.0)
MCH: 30.4 pg (ref 26.0–34.0)
MCHC: 32.9 g/dL (ref 30.0–36.0)
MCV: 92.5 fL (ref 78.0–100.0)
MONO ABS: 0.7 10*3/uL (ref 0.1–1.0)
Monocytes Relative: 7 %
Neutro Abs: 5.4 10*3/uL (ref 1.7–7.7)
Neutrophils Relative %: 58 %
PLATELETS: 230 10*3/uL (ref 150–400)
RBC: 3.35 MIL/uL — AB (ref 3.87–5.11)
RDW: 13.2 % (ref 11.5–15.5)
WBC: 9.4 10*3/uL (ref 4.0–10.5)

## 2017-12-11 MED ORDER — OXYCODONE HCL 10 MG PO TABS: 10.0000 mg | ORAL_TABLET | ORAL | 0 refills | Status: DC | PRN

## 2017-12-11 MED ORDER — CYCLOBENZAPRINE HCL 10 MG PO TABS: 10.0000 mg | ORAL_TABLET | Freq: Three times a day (TID) | ORAL | 0 refills | Status: DC | PRN

## 2017-12-11 NOTE — Progress Notes (Signed)
Physical Therapy Treatment Patient Details Name: Joyce Kelly MRN: [id number] DOB: February 21, 1946 Today's Date: 12/11/2017    History of Present Illness Pt is 72 year old female is a long-standing back pain with previously done an L4-S1 fusion. Now s/p Posterior lumbar fusion 3 levels. PMH includes: Fibromyalgia, neuropathy, dyspnea, R KNEE ARTHROPLASTY, brain surgery (Cerebral aneurysm - has clips ).     PT Comments    Pt progressing well. Pt with 7/10 pain and reports she ran a fever last night. Pt was able to negotiate stairs to safely enter home however it quickly induced fatigue. Acute PT to con't to follow.   Follow Up Recommendations  Home health PT;Supervision for mobility/OOB     Equipment Recommendations       Recommendations for Other Services       Precautions / Restrictions Precautions Precautions: Back Precaution Booklet Issued: Yes (comment) Precaution Comments: reviewed precautions, pt able to state 3/3 Required Braces or Orthoses: Spinal Brace Spinal Brace: Applied in sitting position;Lumbar corset Restrictions Weight Bearing Restrictions: No    Mobility  Bed Mobility Overal bed mobility: Needs Assistance Bed Mobility: Rolling;Sidelying to Sit;Sit to Sidelying Rolling: Supervision Sidelying to sit: Supervision     Sit to sidelying: Supervision General bed mobility comments: v/c's for technique, pt able to complete without assist with HOB flat and no bedrails  Transfers Overall transfer level: Needs assistance Equipment used: Rolling walker (2 wheeled) Transfers: Sit to/from Stand Sit to Stand: Supervision         General transfer comment: v/c's to push up from chair and to reach back for chair but no physical assist required  Ambulation/Gait Ambulation/Gait assistance: Min guard Ambulation Distance (Feet): 250 Feet Assistive device: Rolling walker (2 wheeled) Gait Pattern/deviations: Step-through pattern;Wide base of support;Decreased stride  length Gait velocity: reduced Gait velocity interpretation: Below normal speed for age/gender General Gait Details: guarded   Stairs Stairs: Yes   Stair Management: One rail Right;Step to pattern;Sideways Number of Stairs: 7(to mimic home) General stair comments: pt reports "this is harder than I thought" "this wore me out". pt with mild SOB  Wheelchair Mobility    Modified Rankin (Stroke Patients Only)       Balance Overall balance assessment: Needs assistance Sitting-balance support: Feet supported Sitting balance-Leahy Scale: Good     Standing balance support: No upper extremity supported Standing balance-Leahy Scale: Fair Standing balance comment: pt okay statically but requires support for dynamic balance                            Cognition Arousal/Alertness: Awake/alert Behavior During Therapy: WFL for tasks assessed/performed Overall Cognitive Status: Within Functional Limits for tasks assessed                                        Exercises      General Comments        Pertinent Vitals/Pain Pain Assessment: 0-10 Pain Score: 7  Pain Location: back Pain Descriptors / Indicators: Aching;Sore Pain Intervention(s): Limited activity within patient's tolerance    Home Living                      Prior Function            PT Goals (current goals can now be found in the care plan section) Acute Rehab PT Goals Patient Stated Goal: go  home today Progress towards PT goals: Progressing toward goals    Frequency    Min 5X/week      PT Plan Current plan remains appropriate    Co-evaluation              AM-PAC PT "6 Clicks" Daily Activity  Outcome Measure  Difficulty turning over in bed (including adjusting bedclothes, sheets and blankets)?: None Difficulty moving from lying on back to sitting on the side of the bed? : None Difficulty sitting down on and standing up from a chair with arms (e.g.,  wheelchair, bedside commode, etc,.)?: None Help needed moving to and from a bed to chair (including a wheelchair)?: A Little Help needed walking in hospital room?: A Little Help needed climbing 3-5 steps with a railing? : A Little 6 Click Score: 21    End of Session Equipment Utilized During Treatment: Gait belt;Back brace Activity Tolerance: Patient tolerated treatment well;Patient limited by fatigue Patient left: in bed;with call bell/phone within reach Nurse Communication: Mobility status PT Visit Diagnosis: Unsteadiness on feet (R26.81);Muscle weakness (generalized) (M62.81);Other abnormalities of gait and mobility (R26.89)     Time: [id number] PT Time Calculation (min) (ACUTE ONLY): 23 min  Charges:  $Gait Training: 8-22 mins $Therapeutic Activity: 8-22 mins                    G Codes:       Lewis ShockAshly Karynn Deblasi, PT, DPT Pager #: [phone] Office #: [phone]    Micah Barnier M Gearl Kimbrough 12/11/2017, 8:28 AM

## 2017-12-11 NOTE — Plan of Care (Signed)
Adequate for discharge.

## 2017-12-11 NOTE — Progress Notes (Signed)
Occupational Therapy Treatment Patient Details Name: Joyce Kelly MRN: [id number] DOB: 02-15-1946 Today's Date: 12/11/2017    History of present illness Pt is 72 year old female is a long-standing back pain with previously done an L4-S1 fusion. Now s/p Posterior lumbar fusion 3 levels. PMH includes: Fibromyalgia, neuropathy, dyspnea, R KNEE ARTHROPLASTY, brain surgery (Cerebral aneurysm - has clips ).    OT comments  Pt making good progress with functional goals. OT will continue to follow acutely  Follow Up Recommendations  Home health OT;Supervision - Intermittent    Equipment Recommendations  3 in 1 bedside commode;Tub/shower seat(plans for husband to assist with LB ADLs)    Recommendations for Other Services      Precautions / Restrictions Precautions Precautions: Back Precaution Comments: reviewed precautions, pt able to state 3/3 Required Braces or Orthoses: Spinal Brace Spinal Brace: Applied in sitting position;Lumbar corset Restrictions Weight Bearing Restrictions: No       Mobility Bed Mobility Overal bed mobility: Needs Assistance Bed Mobility: Rolling;Sidelying to Sit Rolling: Supervision Sidelying to sit: Supervision       General bed mobility comments: v/c's for technique, pt able to complete without assist with HOB flat and no bedrails  Transfers Overall transfer level: Needs assistance Equipment used: Rolling walker (2 wheeled)   Sit to Stand: Supervision Stand pivot transfers: Min guard       General transfer comment: used DME for toilet transfer, grab bars and DME for shower transfer    Balance Overall balance assessment: Needs assistance Sitting-balance support: Feet supported Sitting balance-Leahy Scale: Good     Standing balance support: No upper extremity supported Standing balance-Leahy Scale: Fair Standing balance comment: pt okay statically but requires support for dynamic balance                           ADL either  performed or assessed with clinical judgement   ADL Overall ADL's : Needs assistance/impaired     Grooming: Wash/dry hands;Standing;Min guard;Wash/dry face;Supervision/safety;Brushing hair       Lower Body Bathing: Minimal assistance;Sit to/from stand       Lower Body Dressing: Sitting/lateral leans;Minimal assistance;Sit to/from stand   Toilet Transfer: Min guard;Ambulation;RW   Toileting- Architect and Hygiene: Min guard;Sit to/from stand;Sitting/lateral lean;Adhering to back precautions   Tub/ Shower Transfer: Walk-in shower;Min guard;Ambulation;Rolling walker;3 in 1   Functional mobility during ADLs: Min guard;Rolling walker General ADL Comments: pt aware of ADL A/E for home use, tongs/toileting aid  for toileting hygiene     Vision Patient Visual Report: No change from baseline     Perception     Praxis      Cognition Arousal/Alertness: Awake/alert Behavior During Therapy: WFL for tasks assessed/performed Overall Cognitive Status: Within Functional Limits for tasks assessed                                          Exercises     Shoulder Instructions       General Comments      Pertinent Vitals/ Pain       Pain Assessment: 0-10 Pain Score: 5  Pain Location: back Pain Descriptors / Indicators: Aching;Sore Pain Intervention(s): Limited activity within patient's tolerance;Monitored during session;Repositioned  Home Living  Prior Functioning/Environment              Frequency  Min 2X/week        Progress Toward Goals  OT Goals(current goals can now be found in the care plan section)  Progress towards OT goals: Progressing toward goals  Acute Rehab OT Goals Patient Stated Goal: go home today  Plan Discharge plan remains appropriate    Co-evaluation                 AM-PAC PT "6 Clicks" Daily Activity     Outcome Measure   Help from another  person eating meals?: None Help from another person taking care of personal grooming?: None Help from another person toileting, which includes using toliet, bedpan, or urinal?: A Little Help from another person bathing (including washing, rinsing, drying)?: A Little Help from another person to put on and taking off regular upper body clothing?: A Little Help from another person to put on and taking off regular lower body clothing?: A Little 6 Click Score: 20    End of Session Equipment Utilized During Treatment: Gait belt;Rolling walker;Other (comment)(3 in 1)  OT Visit Diagnosis: Unsteadiness on feet (R26.81);Pain Pain - Right/Left: (back) Pain - part of body: (back)   Activity Tolerance Patient tolerated treatment well   Patient Left in chair;Other (comment);in bed(standing with RW at EOB with MD)   Nurse Communication      Functional Assessment Tool Used: AM-PAC 6 Clicks Daily Activity   Time: 1001-1026 OT Time Calculation (min): 25 min  Charges: OT G-codes **NOT FOR INPATIENT CLASS** Functional Assessment Tool Used: AM-PAC 6 Clicks Daily Activity OT General Charges $OT Visit: 1 Visit OT Treatments $Self Care/Home Management : 8-22 mins $Therapeutic Activity: 8-22 mins     Galen ManilaSpencer, Latara Micheli Jeanette 12/11/2017, 12:29 PM

## 2017-12-11 NOTE — Care Management Note (Signed)
Case Management Note  Patient Details  Name: Joyce KannerKay Parthasarathy MRN: [id number] Date of Birth: 04/11/1946  Subjective/Objective:                    Action/Plan: [facility] Pt has already been set up with [facility]. CM called Marcelino DusterMichelle with [facility] and [facility]  Pt has transportation home and supervision at home.   Expected Discharge Date:  12/11/17               Expected Discharge Plan:  Home w Home Health Services  In-House Referral:  NA  Discharge planning Services  CM Consult  Post Acute Care Choice:  Home Health Choice offered to:  Patient  DME Arranged:    DME Agency:     HH Arranged:  PT, OT HH Agency:  [facility]  Status of Service:  Completed, signed off  If discussed at Long Length of Stay Meetings, dates discussed:    Additional Comments:  Kermit BaloKelli F Charrisse Masley, RN 12/11/2017, 11:12 AM

## 2017-12-11 NOTE — Transfer of Care (Signed)
Immediate Anesthesia Transfer of Care Note  Patient: Joyce Kelly  Procedure(s) Performed: Posterior Lateral Fusion revision with iliac screws and Airo (N/A Spine Lumbar) APPLICATION OF INTRAOPERATIVE CT SCAN (N/A Spine Lumbar)  Patient Location: PACU  Anesthesia Type:General  Level of Consciousness: lethargic and responds to stimulation  Airway & Oxygen Therapy: Patient Spontanous Breathing  Post-op Assessment: Report given to RN and Post -op Vital signs reviewed and stable  Post vital signs: Reviewed and stable  Last Vitals:  Vitals Value Taken Time  BP    Temp    Pulse    Resp    SpO2      Last Pain:  Vitals:   12/11/17 0404  TempSrc: Oral  PainSc:       Patients Stated Pain Goal: 3 (12/10/17 1935)  Complications: No apparent anesthesia complications

## 2017-12-11 NOTE — Progress Notes (Signed)
Pt  A&OX4. Temp 102.1. Pt was given tylenol 650mg  PO. Dr. Kerri PerchesNudled was made aware. UA, urine culture,  And blood cultures x2,. Pt was given incentive spirometry and educated on its purpose and usage.

## 2017-12-11 NOTE — Discharge Summary (Signed)
Physician Discharge Summary  Patient ID: Joyce Kelly MRN: [id number] DOB/AGE: 73/01/1946 73 y.o. Estimated body mass index is 36.11 kg/m as calculated from the following:   Height as of this encounter: 5\' 5"  (1.651 m).   Weight as of this encounter: 98.4 kg (217 lb).   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 Postoperatively patient did very well recovered in the floor on the floor was angling and voiding spontaneously tolerating regular or fever on the night prior to discharge home was a for afebrile the morning of discharge workup included CBC which showednormal white count and stable hemoglobin and hematocrit. The patient be discharged her scheduled follow-up in one to 2 weeks with discharge with home health physical therapy and occupational therapy.  Consults: Significant Diagnostic Studies: Treatments:redo posterior lateral fusion L3-4 and L5-S1 with placement of bilateral iliac screws. Discharge Exam: Blood pressure (!) 128/39, pulse 80, temperature 98.3 F (36.8 C), temperature source Oral, resp. rate 20, height 5\' 5"  (1.651 m), weight 98.4 kg (217 lb), SpO2 96 %. Strength out of 5 wound clean dry and intact  Disposition: home  Discharge Instructions    Face-to-face encounter (required for Medicare/Medicaid patients)   Complete by:  As directed    I Aja Whitehair P certify that this patient is under my care and that I, or a nurse practitioner or physician's assistant working with me, had a face-to-face encounter that meets the physician face-to-face encounter  requirements with this patient on 12/11/2017. The encounter with the patient was in whole, or in part for the following medical condition(s) which is the primary reason for home health care (List medical condition): lumbar pseudoarthrosis   The encounter with the patient was in whole, or in part, for the following medical condition, which is the primary reason for home health care:  Pseudoarthrosis lumbar   I certify that, based on my findings, the following services are medically necessary home health services:  Physical therapy   Reason for Medically Necessary Home Health Services:  Therapy- Home Adaptation to Facilitate Safety   My clinical findings support the need for the above services:  Pain interferes with ambulation/mobility   Further, I certify that my clinical findings support that this patient is homebound due to:  Pain interferes with ambulation/mobility   Home Health   Complete by:  As directed    To provide the following care/treatments:   PT OT       Allergies as of 12/11/2017      Reactions   Meloxicam Diarrhea, Shortness Of Breath   Pneumococcal Vac Polyvalent Swelling   Pregabalin Swelling   Codeine Nausea And Vomiting   Amoxicillin    Passed out Has patient had a PCN reaction causing immediate rash, facial/tongue/throat swelling, SOB or lightheadedness with hypotension: Yes Has patient had a PCN reaction causing severe rash involving mucus membranes or skin necrosis: No Has patient had a PCN reaction that required hospitalization: Yes Has patient had a PCN reaction occurring within the last 10 years: Yes If all of the above answers are "NO", then may proceed with Cephalosporin use.   Clindamycin/lincomycin    May have caused c diff  Diclofenac Sodium Other (See Comments)   Tears up stomach    Erythromycin    Made skin feel crazy    Lisinopril    Cough   Nsaids    Tears up stomach   Tramadol Other (See Comments)   Headache   Valsartan Other (See Comments)    Headache, leg cramps      Medication List    TAKE these medications   BIOTIN 5000 5 MG Caps Generic drug:  Biotin Take 5 mg by mouth daily.   Calcium-Magnesium-Zinc Tabs Take 3 tablets by mouth daily.   cyclobenzaprine 10 MG tablet Commonly known as:  FLEXERIL Take 10 mg by mouth at bedtime as needed for muscle spasms. What changed:  Another medication with the same name was added. Make sure you understand how and when to take each.   cyclobenzaprine 10 MG tablet Commonly known as:  FLEXERIL Take 1 tablet (10 mg total) by mouth 3 (three) times daily as needed for muscle spasms. What changed:  You were already taking a medication with the same name, and this prescription was added. Make sure you understand how and when to take each.   diphenhydrAMINE 25 mg capsule Commonly known as:  BENADRYL Take 25 mg by mouth at bedtime as needed for sleep.   gabapentin 100 MG capsule Commonly known as:  NEURONTIN Take 400-600 mg by mouth at bedtime.   hydrochlorothiazide 12.5 MG capsule Commonly known as:  MICROZIDE Take 12.5 mg by mouth daily as needed (swelling).   olmesartan 40 MG tablet Commonly known as:  BENICAR Take 40 mg by mouth daily.   omeprazole 20 MG capsule Commonly known as:  PRILOSEC Take 20 mg by mouth daily as needed (acid reflux).   OVER THE COUNTER MEDICATION Take 1 tablet by mouth daily. Circulegs otc supplement   Oxycodone HCl 10 MG Tabs Take 1 tablet (10 mg total) by mouth every 3 (three) hours as needed for severe pain ((score 7 to 10)).   potassium chloride 10 MEQ tablet Commonly known as:  K-DUR,KLOR-CON Take 20 mEq by mouth daily.   Probiotic Caps Take 1 capsule by mouth daily.   rOPINIRole 2 MG tablet Commonly known as:  REQUIP Take 2 mg by mouth at bedtime. May take a second 2 mg dose as needed for restless legs   tiZANidine 2 MG tablet Commonly known as:  ZANAFLEX Take 2 mg by mouth at bedtime.   traZODone 150 MG tablet Commonly known  as:  DESYREL Take 300 mg by mouth at bedtime.        Signed: Keanu Frickey P 12/11/2017, 10:56 AM

## 2017-12-12 ENCOUNTER — Encounter (HOSPITAL_COMMUNITY): Payer: Self-pay | Admitting: Neurosurgery

## 2017-12-12 LAB — URINE CULTURE

## 2017-12-13 NOTE — Anesthesia Postprocedure Evaluation (Signed)
Anesthesia Post Note  Patient: Joyce Kelly  Procedure(s) Performed: Posterior Lateral Fusion revision with iliac screws and Airo (N/A Spine Lumbar) APPLICATION OF INTRAOPERATIVE CT SCAN (N/A Spine Lumbar)     Patient location during evaluation: PACU Anesthesia Type: General Level of consciousness: awake and alert Pain management: pain level controlled Vital Signs Assessment: post-procedure vital signs reviewed and stable Respiratory status: spontaneous breathing, nonlabored ventilation, respiratory function stable and patient connected to nasal cannula oxygen Cardiovascular status: blood pressure returned to baseline and stable Postop Assessment: no apparent nausea or vomiting Anesthetic complications: no    Last Vitals:  Vitals:   12/11/17 0404 12/11/17 1026  BP: (!) 115/55 (!) 128/39  Pulse: 69 80  Resp: 18 20  Temp: 36.8 C 36.8 C  SpO2: 95% 96%    Last Pain:  Vitals:   12/11/17 1026  TempSrc: Oral  PainSc:                  Jiles GarterJACKSON,[name]

## 2017-12-15 LAB — CULTURE, BLOOD (ROUTINE X 2)
CULTURE: NO GROWTH
Culture: NO GROWTH
Special Requests: ADEQUATE
Special Requests: ADEQUATE

## 2018-01-11 ENCOUNTER — Other Ambulatory Visit (HOSPITAL_BASED_OUTPATIENT_CLINIC_OR_DEPARTMENT_OTHER): Payer: Self-pay | Admitting: Neurosurgery

## 2018-01-11 DIAGNOSIS — S32009K Unspecified fracture of unspecified lumbar vertebra, subsequent encounter for fracture with nonunion: Secondary | ICD-10-CM

## 2018-01-12 ENCOUNTER — Encounter (HOSPITAL_BASED_OUTPATIENT_CLINIC_OR_DEPARTMENT_OTHER): Payer: Self-pay | Admitting: Radiology

## 2018-01-12 ENCOUNTER — Ambulatory Visit (HOSPITAL_BASED_OUTPATIENT_CLINIC_OR_DEPARTMENT_OTHER)
Admission: RE | Admit: 2018-01-12 | Discharge: 2018-01-12 | Disposition: A | Discharge status: Home / Self Care | Payer: Medicare Other | Source: Ambulatory Visit | Attending: Neurosurgery | Admitting: Neurosurgery

## 2018-01-12 DIAGNOSIS — S32009K Unspecified fracture of unspecified lumbar vertebra, subsequent encounter for fracture with nonunion: Secondary | ICD-10-CM

## 2018-01-12 DIAGNOSIS — X58XXXD Exposure to other specified factors, subsequent encounter: Secondary | ICD-10-CM | POA: Insufficient documentation

## 2018-01-30 ENCOUNTER — Other Ambulatory Visit (HOSPITAL_BASED_OUTPATIENT_CLINIC_OR_DEPARTMENT_OTHER): Payer: Self-pay | Admitting: Neurosurgery

## 2018-01-30 DIAGNOSIS — S32009K Unspecified fracture of unspecified lumbar vertebra, subsequent encounter for fracture with nonunion: Secondary | ICD-10-CM

## 2018-01-31 ENCOUNTER — Ambulatory Visit (HOSPITAL_BASED_OUTPATIENT_CLINIC_OR_DEPARTMENT_OTHER)
Admission: RE | Admit: 2018-01-31 | Discharge: 2018-01-31 | Disposition: A | Discharge status: Home / Self Care | Payer: Medicare Other | Source: Ambulatory Visit | Attending: Neurosurgery | Admitting: Neurosurgery

## 2018-01-31 DIAGNOSIS — X58XXXD Exposure to other specified factors, subsequent encounter: Secondary | ICD-10-CM | POA: Insufficient documentation

## 2018-01-31 DIAGNOSIS — S32009K Unspecified fracture of unspecified lumbar vertebra, subsequent encounter for fracture with nonunion: Secondary | ICD-10-CM | POA: Insufficient documentation

## 2018-01-31 DIAGNOSIS — Z981 Arthrodesis status: Secondary | ICD-10-CM | POA: Diagnosis not present

## 2018-02-07 ENCOUNTER — Other Ambulatory Visit: Payer: Self-pay | Admitting: Neurosurgery

## 2018-02-07 DIAGNOSIS — M5126 Other intervertebral disc displacement, lumbar region: Secondary | ICD-10-CM

## 2018-02-09 ENCOUNTER — Ambulatory Visit
Admission: RE | Admit: 2018-02-09 | Discharge: 2018-02-09 | Disposition: A | Discharge status: Home / Self Care | Payer: Medicare Other | Source: Ambulatory Visit | Attending: Neurosurgery | Admitting: Neurosurgery

## 2018-02-09 DIAGNOSIS — M5126 Other intervertebral disc displacement, lumbar region: Secondary | ICD-10-CM

## 2018-02-09 MED ORDER — IOPAMIDOL (ISOVUE-M 200) INJECTION 41%
1.0000 mL | Freq: Once | INTRAMUSCULAR | Status: AC
  Administered 2018-02-09: 1 mL via EPIDURAL

## 2018-02-09 MED ORDER — METHYLPREDNISOLONE ACETATE 40 MG/ML INJ SUSP (RADIOLOG
120.0000 mg | Freq: Once | INTRAMUSCULAR | Status: AC
  Administered 2018-02-09: 120 mg via EPIDURAL

## 2018-11-05 ENCOUNTER — Other Ambulatory Visit: Payer: Self-pay | Admitting: Orthopaedic Surgery

## 2018-11-05 ENCOUNTER — Telehealth: Payer: Self-pay | Admitting: Nurse Practitioner

## 2018-11-05 DIAGNOSIS — M4716 Other spondylosis with myelopathy, lumbar region: Secondary | ICD-10-CM

## 2018-11-05 NOTE — Telephone Encounter (Signed)
Phone call to patient to verify medication list and allergies for myelogram procedure. Pt instructed to hold Trazodone for 48hrs prior to myelogram appointment time. Pt verbalized understanding. 

## 2018-11-08 ENCOUNTER — Inpatient Hospital Stay
Admission: RE | Admit: 2018-11-08 | Discharge: 2018-11-08 | Disposition: A | Discharge status: Home / Self Care | Payer: Medicare Other | Source: Ambulatory Visit | Attending: Orthopaedic Surgery | Admitting: Orthopaedic Surgery

## 2018-11-08 ENCOUNTER — Other Ambulatory Visit: Payer: Medicare Other

## 2018-11-08 NOTE — Discharge Instructions (Signed)
Myelogram Discharge Instructions  1. Go home and rest quietly for the next 24 hours.  It is important to lie flat for the next 24 hours.  Get up only to go to the restroom.  You may lie in the bed or on a couch on your back, your stomach, your left side or your right side.  You may have one pillow under your head.  You may have pillows between your knees while you are on your side or under your knees while you are on your back.  2. DO NOT drive today.  Recline the seat as far back as it will go, while still wearing your seat belt, on the way home.  3. You may get up to go to the bathroom as needed.  You may sit up for 10 minutes to eat.  You may resume your normal diet and medications unless otherwise indicated.  Drink lots of extra fluids today and tomorrow.  4. The incidence of headache, nausea, or vomiting is about 5% (one in 20 patients).  If you develop a headache, lie flat and drink plenty of fluids until the headache goes away.  Caffeinated beverages may be helpful.  If you develop severe nausea and vomiting or a headache that does not go away with flat bed rest, call [phone].  5. You may resume normal activities after your 24 hours of bed rest is over; however, do not exert yourself strongly or do any heavy lifting tomorrow. If when you get up you have a headache when standing, go back to bed and force fluids for another 24 hours.  6. Call your physician for a follow-up appointment.  The results of your myelogram will be sent directly to your physician by the following day.  7. If you have any questions or if complications develop after you arrive home, please call [phone].  Discharge instructions have been explained to the patient.  The patient, or the person responsible for the patient, fully understands these instructions.  YOU MAY RESTART YOUR TRAZODONE TOMORROW 11/09/2018 AT 09:30AM.

## 2018-11-15 ENCOUNTER — Ambulatory Visit
Admission: RE | Admit: 2018-11-15 | Discharge: 2018-11-15 | Disposition: A | Discharge status: Home / Self Care | Payer: Medicare Other | Source: Ambulatory Visit | Attending: Orthopaedic Surgery | Admitting: Orthopaedic Surgery

## 2018-11-15 ENCOUNTER — Other Ambulatory Visit: Payer: Self-pay

## 2018-11-15 VITALS — BP 118/41 | HR 60

## 2018-11-15 DIAGNOSIS — M48061 Spinal stenosis, lumbar region without neurogenic claudication: Secondary | ICD-10-CM

## 2018-11-15 DIAGNOSIS — M4716 Other spondylosis with myelopathy, lumbar region: Secondary | ICD-10-CM

## 2018-11-15 DIAGNOSIS — S32009K Unspecified fracture of unspecified lumbar vertebra, subsequent encounter for fracture with nonunion: Secondary | ICD-10-CM

## 2018-11-15 MED ORDER — MEPERIDINE HCL 100 MG/ML IJ SOLN
75.0000 mg | Freq: Once | INTRAMUSCULAR | Status: AC
  Administered 2018-11-15: 75 mg via INTRAMUSCULAR

## 2018-11-15 MED ORDER — ONDANSETRON HCL 4 MG/2ML IJ SOLN
4.0000 mg | Freq: Once | INTRAMUSCULAR | Status: AC
  Administered 2018-11-15: 4 mg via INTRAMUSCULAR

## 2018-11-15 MED ORDER — IOPAMIDOL (ISOVUE-M 200) INJECTION 41%
15.0000 mL | Freq: Once | INTRAMUSCULAR | Status: AC
  Administered 2018-11-15: 15 mL via INTRATHECAL

## 2018-11-15 MED ORDER — DIAZEPAM 5 MG PO TABS
5.0000 mg | ORAL_TABLET | Freq: Once | ORAL | Status: AC
  Administered 2018-11-15: 5 mg via ORAL

## 2018-11-15 NOTE — Discharge Instructions (Signed)

## 2022-10-19 ENCOUNTER — Ambulatory Visit (INDEPENDENT_AMBULATORY_CARE_PROVIDER_SITE_OTHER): Payer: Medicare Other

## 2022-10-19 ENCOUNTER — Ambulatory Visit
Admission: RE | Admit: 2022-10-19 | Discharge: 2022-10-19 | Disposition: A | Discharge status: Home / Self Care | Payer: Medicare Other | Source: Ambulatory Visit

## 2022-10-19 VITALS — BP 129/81 | HR 62 | Temp 98.4°F | Resp 14

## 2022-10-19 DIAGNOSIS — S79919A Unspecified injury of unspecified hip, initial encounter: Secondary | ICD-10-CM

## 2022-10-19 DIAGNOSIS — M25561 Pain in right knee: Secondary | ICD-10-CM | POA: Diagnosis not present

## 2022-10-19 DIAGNOSIS — M25562 Pain in left knee: Secondary | ICD-10-CM | POA: Diagnosis not present

## 2022-10-19 DIAGNOSIS — W19XXXA Unspecified fall, initial encounter: Secondary | ICD-10-CM

## 2022-10-19 DIAGNOSIS — M545 Low back pain, unspecified: Secondary | ICD-10-CM | POA: Diagnosis not present

## 2022-10-19 DIAGNOSIS — M25552 Pain in left hip: Secondary | ICD-10-CM

## 2022-10-19 DIAGNOSIS — M25551 Pain in right hip: Secondary | ICD-10-CM

## 2022-10-19 MED ORDER — KETOROLAC TROMETHAMINE 30 MG/ML IJ SOLN
30.0000 mg | Freq: Once | INTRAMUSCULAR | Status: AC
  Administered 2022-10-19: 30 mg via INTRAMUSCULAR

## 2022-10-19 MED ORDER — DICLOFENAC SODIUM 1 % EX GEL: 4.0000 g | Freq: Four times a day (QID) | CUTANEOUS | 2 refills | Status: AC

## 2022-10-19 MED ORDER — HYDROCODONE-ACETAMINOPHEN 5-325 MG PO TABS: 1.0000 | ORAL_TABLET | Freq: Two times a day (BID) | ORAL | 0 refills | Status: AC | PRN

## 2022-10-19 NOTE — ED Provider Notes (Signed)
[facility]    CSN: [id number] Arrival date & time: 10/19/22  1244    HISTORY   Chief Complaint  Patient presents with   Back Pain   Hip Pain   Knee Pain   HPI Joyce Kelly is a pleasant, 77 y.o. female who presents to urgent care today. Pt presents with c/o of back pain, bilateral hip and knee pain after a fall that occurred 4 days ago, states she was sitting on a hospital bed which rolled away causing her to fall to the floor, states she tried to catch herself and twisted her body.  Patient reports a history of back surgery in 2016, had a decompression laminectomy.  Patient states she is also had a total knee replacement on the right.  Patient also reports a history of bilateral trochanteric bursitis which has not been bothering her very much until Saturday.  Patient states over the last 4 days her pain has gotten progressively worse and not better.  Patient states she currently sees pain management, has been taking hydrocodone as prescribed which she states is not helping at this time.  Patient ambulated independently into the clinic today.  Patient has normal vital signs on arrival, heart rate 62, blood pressure 129/81.  Patient is requesting x-rays of her lower back, hips and knees today to make sure she did not break anything.  The history is provided by the patient.   Past Medical History:  Diagnosis Date   Anemia    Arthritis    Depression    Diarrhea    Fatty liver    Fibromyalgia    GERD (gastroesophageal reflux disease)    Hypertension    Insomnia    per pt "very severe"   Neuropathy    both feet   Pneumonia    PONV (postoperative nausea and vomiting)    Restless legs syndrome (RLS)    Shortness of breath dyspnea    with exertion   Patient Active Problem List   Diagnosis Date Noted   Pseudoarthrosis of lumbar spine 12/08/2017   Spinal stenosis at L4-L5 level 05/20/2015   Past Surgical History:  Procedure Laterality Date   ABDOMINAL HYSTERECTOMY      partial   APPLICATION OF INTRAOPERATIVE CT SCAN N/A 12/08/2017   Procedure: APPLICATION OF INTRAOPERATIVE CT SCAN;  Surgeon: Kary Kos, MD;  Location: [facility];  Service: Neurosurgery;  Laterality: N/A;   BRAIN SURGERY     Cerebral aneurysm - has clips    CARPAL TUNNEL RELEASE Right    CHOLECYSTECTOMY     COLONOSCOPY     KNEE ARTHROPLASTY Right    KNEE ARTHROSCOPY Right    thumb surgery Left    TONSILLECTOMY     TUBAL LIGATION     OB History   No obstetric history on file.    Home Medications    Prior to Admission medications   Medication Sig Start Date End Date Taking? Authorizing Provider  atenolol (TENORMIN) 25 MG tablet Take by mouth daily.   Yes [provider]  furosemide (LASIX) 20 MG tablet Take 20 mg by mouth.   Yes [provider]  Biotin (BIOTIN 5000) 5 MG CAPS Take 5 mg by mouth daily.    [provider]  diphenhydrAMINE (BENADRYL) 25 mg capsule Take 25 mg by mouth at bedtime as needed for sleep.     [provider]  gabapentin (NEURONTIN) 100 MG capsule Take 400-600 mg by mouth at bedtime.     [provider]  hydrochlorothiazide (MICROZIDE) 12.5 MG capsule Take 12.5 mg by mouth daily as needed (swelling).  Patient not taking: Reported on 10/19/2022    [provider]  HYDROcodone-acetaminophen (NORCO) 7.5-325 MG tablet Take 1 tablet by mouth every 6 (six) hours as needed for moderate pain.    [provider]  Multiple Minerals (CALCIUM-MAGNESIUM-ZINC) TABS Take 3 tablets by mouth daily.     [provider]  olmesartan (BENICAR) 40 MG tablet Take 40 mg by mouth daily. Patient not taking: Reported on 10/19/2022    [provider]  omeprazole (PRILOSEC) 20 MG capsule Take 20 mg by mouth daily as needed (acid reflux).    [provider]  OVER THE COUNTER MEDICATION Take 1 tablet by mouth daily. Circulegs otc supplement    [provider]  oxyCODONE 10 MG TABS Take 1 tablet (10  mg total) by mouth every 3 (three) hours as needed for severe pain ((score 7 to 10)). Patient not taking: Reported on 11/05/2018 12/11/17   Kary Kos, MD  potassium chloride (K-DUR,KLOR-CON) 10 MEQ tablet Take 20 mEq by mouth daily.    [provider]  Probiotic CAPS Take 1 capsule by mouth daily.    [provider]  rOPINIRole (REQUIP) 2 MG tablet Take 2 mg by mouth at bedtime. May take a second 2 mg dose as needed for restless legs    [provider]  tiZANidine (ZANAFLEX) 2 MG tablet Take 2 mg by mouth at bedtime.    [provider]  traZODone (DESYREL) 150 MG tablet Take 300 mg by mouth at bedtime.    [provider]    Family History Family History  Problem Relation Age of Onset   Thyroid disease Mother    Hypertension Mother    Lung disease Father    Social History Social History   Tobacco Use   Smoking status: Former    Types: Cigarettes    Quit date: 05/11/1982    Years since quitting: 40.4   Smokeless tobacco: Never  Vaping Use   Vaping Use: Never used  Substance Use Topics   Alcohol use: Yes    Comment: rare   Drug use: No   Allergies   Lisinopril, Pneumococcal vac polyvalent, Pregabalin, Amoxicillin, Codeine, Diclofenac sodium, Erythromycin, Meloxicam, Nsaids, Tramadol, and Valsartan  Review of Systems Review of Systems Pertinent findings revealed after performing a 14 point review of systems has been noted in the history of present illness.  Physical Exam Vital Signs BP 129/81 (BP Location: Right Arm)   Pulse 62   Temp 98.4 F (36.9 C) (Oral)   Resp 14   SpO2 96%   No data found.  Physical Exam Vitals and nursing note reviewed.  Constitutional:      General: She is not in acute distress.    Appearance: Normal appearance.  HENT:     Head: Normocephalic and atraumatic.  Eyes:     Pupils: Pupils are equal, round, and reactive to light.  Cardiovascular:     Rate and Rhythm: Normal rate and regular rhythm.   Pulmonary:     Effort: Pulmonary effort is normal.     Breath sounds: Normal breath sounds.  Musculoskeletal:     Right shoulder: Tenderness present.     Cervical back: Normal, normal range of motion and neck supple.     Thoracic back: Normal.     Lumbar back: Spasms, tenderness and bony tenderness present. No swelling, edema, deformity or signs of trauma.  Normal range of motion. Positive right straight leg raise test and positive left straight leg raise test. No scoliosis.     Right hip: Tenderness present.     Left hip: Tenderness present.     Right knee: Swelling and deformity present. No tenderness.     Left knee: Swelling and deformity present. Tenderness present.  Skin:    General: Skin is warm and dry.  Neurological:     General: No focal deficit present.     Mental Status: She is alert and oriented to person, place, and time. Mental status is at baseline.  Psychiatric:        Mood and Affect: Mood normal.        Behavior: Behavior normal.        Thought Content: Thought content normal.        Judgment: Judgment normal.     UC Couse / Diagnostics / Procedures:     Radiology DG Lumbar Spine Complete  Result Date: 10/19/2022 CLINICAL DATA:  Fall 4 days ago, pain EXAM: LUMBAR SPINE - COMPLETE 4+ VIEW COMPARISON:  None Available. FINDINGS: No fracture or dislocation of the lumbar spine. Posterior lumbosacral fusion of L3 and below. Severe disc degenerative and osteophytosis of L1-L3. Nonobstructive pattern of overlying bowel gas. IMPRESSION: No fracture or dislocation of the lumbar spine. Posterior lumbosacral fusion of L3 and below. Severe disc degenerative and osteophytosis of L1-L3. Electronically Signed   By: Delanna Ahmadi M.D.   On: 10/19/2022 14:30   DG HIPS BILAT WITH PELVIS 3-4 VIEWS  Result Date: 10/19/2022 CLINICAL DATA:  Bilateral hip pain after fall 4 days ago. EXAM: DG HIP (WITH OR WITHOUT PELVIS) 3-4V BILAT COMPARISON:  None Available. FINDINGS: There is no  evidence of hip fracture or dislocation. There is no evidence of arthropathy or other focal bone abnormality. IMPRESSION: Negative. Electronically Signed   By: Marijo Conception M.D.   On: 10/19/2022 14:27   DG Knee 2 Views Right  Result Date: 10/19/2022 CLINICAL DATA:  Bilateral knee pain after fall 4 days ago. EXAM: RIGHT KNEE - 1-2 VIEW COMPARISON:  None Available. FINDINGS: Status post right total knee arthroplasty. No fracture, dislocation or effusion is noted. IMPRESSION: No acute abnormality seen. Electronically Signed   By: Marijo Conception M.D.   On: 10/19/2022 14:26   DG Knee 2 Views Left  Result Date: 10/19/2022 CLINICAL DATA:  Bilateral knee pain after fall 4 days ago. EXAM: LEFT KNEE - 1-2 VIEW COMPARISON:  None Available. FINDINGS: No evidence of fracture, dislocation, or joint effusion. No evidence of arthropathy or other focal bone abnormality. Soft tissues are unremarkable. IMPRESSION: Negative. Electronically Signed   By: Marijo Conception M.D.   On: 10/19/2022 14:25    Procedures Procedures (including critical care time) EKG  Pending results:  Labs Reviewed - No data to display  Medications Ordered in UC: Medications  ketorolac (TORADOL) 30 MG/ML injection 30 mg (30 mg Intramuscular Given 10/19/22 1408)    UC Diagnoses / Final Clinical Impressions(s)   I have reviewed the triage vital signs and the nursing notes.  Pertinent labs & imaging results that were available during my care of the patient were reviewed by me and considered in my medical decision making (see chart for details).    Final diagnoses:  Acute bilateral low back pain without sciatica  Right hip pain  Acute pain of both knees  Left hip pain  Fall, initial encounter    Patient advised of x-ray  findings. Patient was provided with an injection of ketorolac during their visit today for acute pain relief. Patient was advised to: Add hydrocodone 5-3 25 to her current dose of hydrocodone 7.5-325 due to  patient reported intolerance of NSAIDs and gabapentin.  Patient provided with 10 tablets for 5 days.  PDMP reviewed by me. Apply ice pack to affected area 4 times daily for 20 minutes each time Apply topical Voltaren gel 4 times daily as needed Patient advised to reach out to her pain management provider to discuss this plan before picking up the prescription for hydrocodone. Return precautions advised  Please see discharge instructions below for details of plan of care as provided to patient. ED Prescriptions     Medication Sig Dispense Auth. Provider   diclofenac Sodium (VOLTAREN) 1 % GEL Apply 4 g topically 4 (four) times daily. Apply to affected areas 4 times daily as needed for pain. 100 g Lynden Oxford Scales, PA-C   HYDROcodone-acetaminophen (NORCO/VICODIN) 5-325 MG tablet Take 1 tablet by mouth every 12 (twelve) hours as needed for up to 5 days for severe pain. Take hydrocodone 5/325 with hydrocodone 7.5/5 for a total of hydrocodone 12.5/650 as needed for severe pain. 10 tablet Lynden Oxford Scales, PA-C      I have reviewed the PDMP during this encounter.  Discharge Instructions:   Discharge Instructions      None of your x-rays showed any signs of acute injury.  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  I have a feeling that your recent fall has aggravated your lumbar spine which is why you are experiencing such an increase in pain.  I provided you with a prescription for hydrocodone 5 mg that you can take in addition to your current prescription of hydrocodone 7.5 for severe pain as you are hydrocodone 7.5 has been prescribed for moderate pain only.  I recommend that you continue taking tizanidine as well.  As we discussed, it is very important that you reach out to your pain management provider to let them know that you were seen today and that this prescription has been  provided for you.  I have also sent a prescription for a topical anti-inflammatory pain medication called Voltaren that you can apply to the affected areas every 6-8 hours as needed..  I also recommend that you consider applying ice to your lower back to reduce inflammation which will in turn provide relief of pain.  Thank you for visiting urgent care today.  We appreciate the opportunity to precipitate your care.      Disposition Upon Discharge:  Condition: stable for discharge home Home: take medications as prescribed; routine discharge instructions as discussed; follow up as advised.  Patient presented with an acute illness with associated systemic symptoms and significant discomfort requiring urgent management. In my opinion, this is a condition that a prudent lay person (someone who possesses an average knowledge of health and medicine) may potentially expect to result in complications if not addressed urgently such as respiratory distress, impairment of bodily function or dysfunction of bodily organs.   Routine symptom specific, illness specific and/or disease specific instructions were discussed with the patient and/or caregiver at length.   As such, the patient has been evaluated and assessed, work-up was performed and treatment was provided in alignment with urgent care protocols and evidence based medicine.  Patient/parent/caregiver has been advised that the patient may require follow  up for further testing and treatment if the symptoms continue in spite of treatment, as clinically indicated and appropriate.  Patient/parent/caregiver has been advised to report to orthopedic urgent care clinic or return to the [facility] or PCP in 3-5 days if no better; follow-up with orthopedics, PCP or the Emergency Department if new signs and symptoms develop or if the current signs or symptoms continue to change or worsen for further workup, evaluation and treatment as clinically indicated and  appropriate  The patient will follow up with their current PCP if and as advised. If the patient does not currently have a PCP we will have assisted them in obtaining one.   The patient may need specialty follow up if the symptoms continue, in spite of conservative treatment and management, for further workup, evaluation, consultation and treatment as clinically indicated and appropriate.  Patient/parent/caregiver verbalized understanding and agreement of plan as discussed.  All questions were addressed during visit.  Please see discharge instructions below for further details of plan.  This office note has been dictated using Museum/gallery curator.  Unfortunately, this method of dictation can sometimes lead to typographical or grammatical errors.  I apologize for your inconvenience in advance if this occurs.  Please do not hesitate to reach out to me if clarification is needed.      Lynden Oxford Scales, PA-C 10/19/22 1453

## 2022-10-19 NOTE — Discharge Instructions (Signed)
None of your x-rays showed any signs of acute injury.  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  I have a feeling that your recent fall has aggravated your lumbar spine which is why you are experiencing such an increase in pain.  I provided you with a prescription for hydrocodone 5 mg that you can take in addition to your current prescription of hydrocodone 7.5 for severe pain as you are hydrocodone 7.5 has been prescribed for moderate pain only.  I recommend that you continue taking tizanidine as well.  As we discussed, it is very important that you reach out to your pain management provider to let them know that you were seen today and that this prescription has been provided for you.  I have also sent a prescription for a topical anti-inflammatory pain medication called Voltaren that you can apply to the affected areas every 6-8 hours as needed..  I also recommend that you consider applying ice to your lower back to reduce inflammation which will in turn provide relief of pain.  Thank you for visiting urgent care today.  We appreciate the opportunity to precipitate your care.

## 2022-10-19 NOTE — ED Triage Notes (Signed)
Pt presents with c/o of back pain, bilateral hip and knee pain after a fall that occurred Saturday. Pt has hx of previous back surgery.

## 2023-09-26 ENCOUNTER — Other Ambulatory Visit (HOSPITAL_BASED_OUTPATIENT_CLINIC_OR_DEPARTMENT_OTHER): Payer: Self-pay

## 2023-09-26 MED ORDER — OLMESARTAN MEDOXOMIL 20 MG PO TABS
20.0000 mg | ORAL_TABLET | Freq: Every day | ORAL | 1 refills | Status: DC
  Filled 2023-09-26: qty 90, 90d supply, fill #0

## 2023-09-27 ENCOUNTER — Other Ambulatory Visit (HOSPITAL_BASED_OUTPATIENT_CLINIC_OR_DEPARTMENT_OTHER): Payer: Self-pay

## 2023-09-27 MED ORDER — GABAPENTIN 100 MG PO CAPS
100.0000 mg | ORAL_CAPSULE | Freq: Two times a day (BID) | ORAL | 3 refills | Status: DC
  Filled 2023-09-27: qty 180, 90d supply, fill #0
  Filled 2023-12-25 (×2): qty 180, 90d supply, fill #1

## 2023-09-29 ENCOUNTER — Other Ambulatory Visit (HOSPITAL_BASED_OUTPATIENT_CLINIC_OR_DEPARTMENT_OTHER): Payer: Self-pay

## 2023-09-29 MED ORDER — WEGOVY 0.25 MG/0.5ML ~~LOC~~ SOAJ
0.2500 mg | SUBCUTANEOUS | 0 refills | Status: DC
  Filled 2023-09-29: qty 2, 28d supply, fill #0

## 2023-10-03 ENCOUNTER — Other Ambulatory Visit (HOSPITAL_BASED_OUTPATIENT_CLINIC_OR_DEPARTMENT_OTHER): Payer: Self-pay

## 2023-10-03 MED ORDER — AMLODIPINE BESYLATE 2.5 MG PO TABS
2.5000 mg | ORAL_TABLET | Freq: Every day | ORAL | 2 refills | Status: DC
  Filled 2023-10-03: qty 30, 30d supply, fill #0

## 2023-11-22 ENCOUNTER — Other Ambulatory Visit (HOSPITAL_BASED_OUTPATIENT_CLINIC_OR_DEPARTMENT_OTHER): Payer: Self-pay

## 2023-11-22 MED ORDER — HYDROCHLOROTHIAZIDE 12.5 MG PO TABS
12.5000 mg | ORAL_TABLET | Freq: Every day | ORAL | 1 refills | Status: DC
  Filled 2023-11-22: qty 30, 30d supply, fill #0

## 2023-11-23 ENCOUNTER — Other Ambulatory Visit (HOSPITAL_BASED_OUTPATIENT_CLINIC_OR_DEPARTMENT_OTHER): Payer: Self-pay

## 2023-12-25 ENCOUNTER — Other Ambulatory Visit (HOSPITAL_BASED_OUTPATIENT_CLINIC_OR_DEPARTMENT_OTHER): Payer: Self-pay

## 2023-12-25 ENCOUNTER — Other Ambulatory Visit: Payer: Self-pay
